# Patient Record
Sex: Male | Born: 1960 | Race: White | Hispanic: No | Smoking: Heavy tobacco smoker
Health system: Southern US, Community
[De-identification: ages and names within clinical notes are randomized; demographics above are authoritative.]

## PROBLEM LIST (undated history)

## (undated) DIAGNOSIS — N189 Chronic kidney disease, unspecified: Secondary | ICD-10-CM

## (undated) DIAGNOSIS — E785 Hyperlipidemia, unspecified: Secondary | ICD-10-CM

## (undated) DIAGNOSIS — M1990 Unspecified osteoarthritis, unspecified site: Secondary | ICD-10-CM

## (undated) DIAGNOSIS — I10 Essential (primary) hypertension: Secondary | ICD-10-CM

## (undated) DIAGNOSIS — R0602 Shortness of breath: Secondary | ICD-10-CM

## (undated) DIAGNOSIS — K219 Gastro-esophageal reflux disease without esophagitis: Secondary | ICD-10-CM

## (undated) DIAGNOSIS — F419 Anxiety disorder, unspecified: Secondary | ICD-10-CM

## (undated) DIAGNOSIS — J449 Chronic obstructive pulmonary disease, unspecified: Secondary | ICD-10-CM

## (undated) HISTORY — PX: ANAL FISTULOTOMY: SHX6423

## (undated) HISTORY — PX: CYSTOSCOPY: SUR368

## (undated) HISTORY — PX: OTHER SURGICAL HISTORY: SHX169

---

## 2004-06-19 ENCOUNTER — Ambulatory Visit (HOSPITAL_COMMUNITY): Admission: RE | Admit: 2004-06-19 | Discharge: 2004-06-19 | Payer: Self-pay | Admitting: *Deleted

## 2004-12-02 ENCOUNTER — Ambulatory Visit (HOSPITAL_COMMUNITY): Admission: RE | Admit: 2004-12-02 | Discharge: 2004-12-02 | Payer: Self-pay | Admitting: Family Medicine

## 2006-04-04 ENCOUNTER — Ambulatory Visit (HOSPITAL_COMMUNITY): Admission: RE | Admit: 2006-04-04 | Discharge: 2006-04-04 | Payer: Self-pay | Admitting: Family Medicine

## 2007-01-04 ENCOUNTER — Ambulatory Visit (HOSPITAL_COMMUNITY): Admission: RE | Admit: 2007-01-04 | Discharge: 2007-01-04 | Payer: Self-pay | Admitting: General Surgery

## 2008-06-24 ENCOUNTER — Ambulatory Visit (HOSPITAL_COMMUNITY): Admission: RE | Admit: 2008-06-24 | Discharge: 2008-06-24 | Payer: Self-pay | Admitting: Family Medicine

## 2008-07-24 ENCOUNTER — Ambulatory Visit (HOSPITAL_COMMUNITY): Admission: RE | Admit: 2008-07-24 | Discharge: 2008-07-24 | Payer: Self-pay | Admitting: Family Medicine

## 2008-07-28 ENCOUNTER — Ambulatory Visit (HOSPITAL_COMMUNITY): Admission: RE | Admit: 2008-07-28 | Discharge: 2008-07-28 | Payer: Self-pay | Admitting: Family Medicine

## 2008-08-27 ENCOUNTER — Encounter: Admission: RE | Admit: 2008-08-27 | Discharge: 2008-08-27 | Payer: Self-pay | Admitting: Neurosurgery

## 2008-10-20 ENCOUNTER — Encounter: Admission: RE | Admit: 2008-10-20 | Discharge: 2008-10-20 | Payer: Self-pay | Admitting: Neurosurgery

## 2009-03-30 ENCOUNTER — Encounter: Admission: RE | Admit: 2009-03-30 | Discharge: 2009-03-30 | Payer: Self-pay | Admitting: Neurosurgery

## 2010-05-24 ENCOUNTER — Ambulatory Visit (HOSPITAL_COMMUNITY): Admission: RE | Admit: 2010-05-24 | Discharge: 2010-05-24 | Payer: Self-pay | Admitting: Internal Medicine

## 2010-06-13 ENCOUNTER — Ambulatory Visit (HOSPITAL_BASED_OUTPATIENT_CLINIC_OR_DEPARTMENT_OTHER): Admission: RE | Admit: 2010-06-13 | Discharge: 2010-06-13 | Payer: Self-pay | Admitting: Urology

## 2010-09-22 ENCOUNTER — Encounter: Payer: Self-pay | Admitting: Neurosurgery

## 2010-12-24 ENCOUNTER — Ambulatory Visit: Payer: BC Managed Care – PPO | Attending: Family Medicine

## 2010-12-24 DIAGNOSIS — G4733 Obstructive sleep apnea (adult) (pediatric): Secondary | ICD-10-CM | POA: Insufficient documentation

## 2011-01-01 NOTE — Procedures (Signed)
NAME:  FLYNN, GWYN NO.:  [id number]  MEDICAL RECORD NO.:  [id number]          PATIENT TYPE:  OUT  LOCATION:  SLEEP LAB                     FACILITY:  [facility]  PHYSICIAN:  Edenilson Austad A. Gerilyn Pilgrim, M.D. DATE OF BIRTH:  1961/05/24  DATE OF STUDY:  12/24/2010                           NOCTURNAL POLYSOMNOGRAM  REFERRING PHYSICIAN:  INDICATION FOR STUDY:  This is a 50 year old man who presents with snoring, witnessed apnea, restless sleep, and insomnia.  This study is being done to evaluate for obstructive sleep apnea syndrome.  EPWORTH SLEEPINESS SCORE:  Epworth sleepiness scale 6.  BMI 41.  MEDICATIONS:  Celebrex, Lortab, Protonix, and Valium.  ALLERGIES: 1. ALBUTEROL. 2. SYMBICORT. 3. SIMVASTATIN. 4. VALIUM.  SLEEP ARCHITECTURE:  The total recording time is 369 minutes.  Sleep efficiency is 85%.  Sleep latency 12 minutes.  REM latency 113 minutes. Stage N1 6.5%, N2 78.4%, N3 30.8%, and REM sleep 11.3%.  RESPIRATORY DATA:  Baseline oxygen saturation is 96, lowest saturation 84 during REM sleep.  Diagnostic AHI is 8.2 and RDI 9.  OXYGEN DATA:  CARDIAC DATA:  Average heart rate is 78 with no significant dysrhythmias observed.  MOVEMENT-PARASOMNIA:  PLM index zero.  IMPRESSIONS-RECOMMENDATIONS:  Mild obstructive sleep apnea syndrome, not requiring positive pressure treatment.     Vandana Haman A. Gerilyn Pilgrim, M.D. Electronically Signed 01/02/2011 10:47:03    KAD/MEDQ  D:  01/01/2011 09:31:45  T:  01/01/2011 21:43:53  Job:  [id number]

## 2011-01-17 NOTE — H&P (Signed)
NAME:  Brandon Nelson, RAO NO.:  [id number]   MEDICAL RECORD NO.:  [id number]           PATIENT TYPE:   LOCATION:                                 FACILITY:   PHYSICIAN:  Dalia Heading, M.D.  DATE OF BIRTH:  1961/04/04   DATE OF ADMISSION:  06/18/2004  DATE OF DISCHARGE:  LH                                HISTORY & PHYSICAL   CHIEF COMPLAINT:  Hematochezia.   HISTORY OF PRESENT ILLNESS:  The patient is a 50 year old white male who was  referred for endoscopic evaluation.  He needs colonoscopy for hematochezia.  It has been present intermittently for many years.  No abdominal pain,  weight loss, nausea, vomiting, diarrhea, constipation, or melena have been  noted.  He has never had a colonoscopy.  There is no family history of colon  carcinoma.   PAST MEDICAL HISTORY:  COPD.   PAST SURGICAL HISTORY:  Knee and foot surgeries.   CURRENT MEDICATIONS:  1.  Hydrocortisone suppositories.  2.  Ibuprofen p.r.n.  3.  Albuterol inhalers as needed.   ALLERGIES:  No known drug allergies.   REVIEW OF SYSTEMS:  The patient does smoke heavily daily.  He denies any  significant alcohol use.  Denies any other cardiopulmonary difficulties or  bleeding disorders.   PHYSICAL EXAMINATION:  GENERAL:  The patient is a well-developed, well-  nourished white male in no acute distress.  VITAL SIGNS:  He is afebrile, and vital signs are stable.  LUNGS:  Clear to auscultation with equal breath sounds bilaterally.  HEART:  Regular rate and rhythm without S3, S4, or murmurs.  ABDOMEN:  Soft, nontender, nondistended.  No hepatosplenomegaly or masses  are noted,.  RECTAL:  Examination deferred to the procedure.   IMPRESSION:  Hematochezia.   PLAN:  The patient is scheduled for a colonoscopy on June 19, 2004.  The  risks and benefits of the procedure including bleeding and perforation were  fully explained to the patient who gave informed consent.     Mark   MAJ/MEDQ  D:   06/18/2004  T:  06/18/2004  Job:  [id number]   cc:   [facility]  Fax: [phone]

## 2011-01-17 NOTE — H&P (Signed)
NAME:  Brandon Nelson, Brandon Nelson                  ACCOUNT NO.:  O   MEDICAL RECORD NO.:  [id number]          PATIENT TYPE:  AMB   LOCATION:  DAY                           FACILITY:  [facility]   PHYSICIAN:  Dalia Heading, M.D.  DATE OF BIRTH:  1961/01/05   DATE OF ADMISSION:  DATE OF DISCHARGE:  LH                              HISTORY & PHYSICAL   CHIEF COMPLAINT:  Recurrent perirectal abscess.   HISTORY OF PRESENT ILLNESS:  The patient is a 50 year old white male who  was is referred for evaluation and treatment of a recurrent perirectal  abscess.  This was  treated earlier this year with antibiotics.  It has  subsequently returned.  He now presents for surgery.   PAST MEDICAL HISTORY:  Past medical history includes COPD.   PAST SURGICAL HISTORY:  Knee surgeries.   CURRENT MEDICATIONS:  Proventil and nebulizers.   ALLERGIES:  No known drug allergies.   REVIEW OF SYSTEMS:  The patient smokes two pack cigarettes a day.  Denies any alcohol use.   PHYSICAL EXAMINATION:  GENERAL:  the patient is a well-developed, well-  nourished white male in no acute distress.  LUNGS:  Clear to auscultation with equal breath sounds bilaterally.  HEART:  Regular rate and rhythm without S3, S4, or murmurs.  RECTAL:  A draining abscess along the left side of the anus.   IMPRESSION:  Recurrent perirectal abscess, question anal fistula.   PLAN:  The patient is scheduled for an anal fistulotomy on Jan 04, 2007.  Risks and benefits of the procedure including bleeding, infection, and  recurrence of the abscess were fully explained to the patient who, gave  informed consent.      Dalia Heading, M.D.  Electronically Signed     MAJ/MEDQ  D:  12/31/2006  T:  12/31/2006  Job:  [id number]   cc:   [facility]  Fax: [phone]   Patrica Duel, M.D.  Fax: [phone]

## 2011-01-17 NOTE — Op Note (Signed)
NAME:  Brandon Nelson, Brandon Nelson NO.:  [id number]   MEDICAL RECORD NO.:  [id number]          PATIENT TYPE:  AMB   LOCATION:  DAY                           FACILITY:  [facility]   PHYSICIAN:  Dalia Heading, M.D.  DATE OF BIRTH:  02-15-1961   DATE OF PROCEDURE:  01/04/2007  DATE OF DISCHARGE:                               OPERATIVE REPORT   PREOPERATIVE DIAGNOSIS:  Anal fistula.   POSTOPERATIVE DIAGNOSIS:  Anal fistula.   PROCEDURE:  Anal fistulotomy.   SURGEON:  Dr. Franky Macho.   ANESTHESIA:  General.   INDICATIONS:  The patient is a 50 year old white male with recurrent  perirectal abscess along the left side of the anus.  The patient now  comes to the operating for a fistulotomy.  Risks and benefits of  procedure, the procedure including bleeding, infection, and recurrence  of the fistula were fully explained to the patient, who gave informed  consent.   PROCEDURE NOTE:  The patient was placed in the lithotomy position after  general anesthesia was administered.  The perineum was prepped and  draped in the usual sterile technique with Betadine.  Surgical site  confirmation was performed.   The patient was noted to have an indurated, closed area at the 2 o'clock  position at the anal verge.  Incision was made and the patient was noted  to have a significant amount of granulation and necrotic subcutaneous  tissue.  This was debrided without difficulty.  Care was taken to avoid  the external sphincter muscle.  A probe was then used to unroof the  tract.  It was noted to go submucosally, though no direct entrance into  the rectum at the dentate line was noted.  The track was extended  slightly, though the whole tunnel was not unroofed as there was no  entrance point.  0.5 cm Sensorcaine was instilled in the surrounding  wound.  Any bleeding was controlled using Bovie electrocautery.  Viscous  Xylocaine was then placed around the anus.   All tape and counts correct end  the procedure.  The patient was awakened  and transferred to PACU in stable condition.   COMPLICATIONS:  None.   SPECIMEN:  None.   BLOOD LOSS:  Minimal.      Dalia Heading, M.D.  Electronically Signed     MAJ/MEDQ  D:  01/04/2007  T:  01/04/2007  Job:  [id number]   cc:   Patrica Duel, M.D.  Fax: [phone]

## 2012-02-23 ENCOUNTER — Other Ambulatory Visit: Payer: Self-pay | Admitting: Orthopedic Surgery

## 2012-02-23 DIAGNOSIS — M25511 Pain in right shoulder: Secondary | ICD-10-CM

## 2012-03-06 ENCOUNTER — Ambulatory Visit
Admission: RE | Admit: 2012-03-06 | Discharge: 2012-03-06 | Disposition: A | Discharge status: Home / Self Care | Payer: BC Managed Care – PPO | Source: Ambulatory Visit | Attending: Orthopedic Surgery | Admitting: Orthopedic Surgery

## 2012-03-06 DIAGNOSIS — M25511 Pain in right shoulder: Secondary | ICD-10-CM

## 2013-07-05 ENCOUNTER — Encounter (HOSPITAL_COMMUNITY): Payer: Self-pay | Admitting: Pharmacy Technician

## 2013-07-05 ENCOUNTER — Encounter (HOSPITAL_COMMUNITY): Payer: Self-pay

## 2013-07-05 ENCOUNTER — Other Ambulatory Visit: Payer: Self-pay

## 2013-07-05 ENCOUNTER — Encounter (HOSPITAL_COMMUNITY)
Admission: RE | Admit: 2013-07-05 | Discharge: 2013-07-05 | Disposition: A | Discharge status: Home / Self Care | Payer: BC Managed Care – PPO | Source: Ambulatory Visit | Attending: Urology | Admitting: Urology

## 2013-07-05 HISTORY — DX: Chronic kidney disease, unspecified: N18.9

## 2013-07-05 HISTORY — DX: Chronic obstructive pulmonary disease, unspecified: J44.9

## 2013-07-05 HISTORY — DX: Gastro-esophageal reflux disease without esophagitis: K21.9

## 2013-07-05 HISTORY — DX: Shortness of breath: R06.02

## 2013-07-05 HISTORY — DX: Unspecified osteoarthritis, unspecified site: M19.90

## 2013-07-05 HISTORY — DX: Hyperlipidemia, unspecified: E78.5

## 2013-07-05 HISTORY — DX: Anxiety disorder, unspecified: F41.9

## 2013-07-05 LAB — BASIC METABOLIC PANEL
BUN: 17 mg/dL (ref 6–23)
CO2: 28 mEq/L (ref 19–32)
Calcium: 9.4 mg/dL (ref 8.4–10.5)
Creatinine, Ser: 0.74 mg/dL (ref 0.50–1.35)
GFR calc Af Amer: >90 mL/min (ref >90)
GFR calc non Af Amer: >90 mL/min (ref >90)
Glucose, Bld: 145 mg/dL — ABNORMAL HIGH (ref 70–99)
Potassium: 3.9 mEq/L (ref 3.5–5.1)

## 2013-07-05 LAB — CBC
Platelets: 283 10*3/uL (ref 150–400)
RDW: 13.3 % (ref 11.5–15.5)

## 2013-07-05 NOTE — Patient Instructions (Addendum)
Brandon Nelson  07/05/2013   Your procedure is scheduled on:  07/06/13  Report to [facility] at 1030 AM.  Call this number if you have problems the morning of surgery: [phone]   Remember:   Do not eat food or drink liquids after midnight.   Take these medicines the morning of surgery with A SIP OF WATER: zofran, protonix, valium & pain pill.  Use symbicort inhaler before you come.   Do not wear jewelry, make-up or nail polish.  Do not wear lotions, powders, or perfumes. You may wear deodorant.  Do not shave 48 hours prior to surgery. Men may shave face and neck.  Do not bring valuables to the hospital.  [facility] is not responsible                  for any belongings or valuables.               Contacts, dentures or bridgework may not be worn into surgery.  Leave suitcase in the car. After surgery it may be brought to your room.  For patients admitted to the hospital, discharge time is determined by your                treatment team.               Patients discharged the day of surgery will not be allowed to drive  home.  Name and phone number of your driver: family  Special Instructions: Shower using CHG 2 nights before surgery and the night before surgery.  If you shower the day of surgery use CHG.  Use special wash - you have one bottle of CHG for all showers.  You should use approximately 1/3 of the bottle for each shower.   Please read over the following fact sheets that you were given: Pain Booklet, Surgical Site Infection Prevention, Anesthesia Post-op Instructions and Care and Recovery After Surgery   PATIENT INSTRUCTIONS POST-ANESTHESIA  IMMEDIATELY FOLLOWING SURGERY:  Do not drive or operate machinery for the first twenty four hours after surgery.  Do not make any important decisions for twenty four hours after surgery or while taking narcotic pain medications or sedatives.  If you develop intractable nausea and vomiting or a severe headache please notify your doctor  immediately.  FOLLOW-UP:  Please make an appointment with your surgeon as instructed. You do not need to follow up with anesthesia unless specifically instructed to do so.  WOUND CARE INSTRUCTIONS (if applicable):  Keep a dry clean dressing on the anesthesia/puncture wound site if there is drainage.  Once the wound has quit draining you may leave it open to air.  Generally you should leave the bandage intact for twenty four hours unless there is drainage.  If the epidural site drains for more than 36-48 hours please call the anesthesia department.  QUESTIONS?:  Please feel free to call your physician or the hospital operator if you have any questions, and they will be happy to assist you.      Cystoscopy Cystoscopy is a procedure that is used to help your caregiver diagnose and sometimes treat conditions that affect your lower urinary tract. Your lower urinary tract includes your bladder and the tube through which urine passes from your bladder out of your body (urethra). Cystoscopy is performed with a thin, tube-shaped instrument (cystoscope). The cystoscope has lenses and a light at the end so that your caregiver can see inside your bladder. The cystoscope is inserted  at the entrance of your urethra. Your caregiver guides it through your urethra and into your bladder. There are two main types of cystoscopy:  Flexible cystoscopy (with a flexible cystoscope).  Rigid cystoscopy (with a rigid cystoscope). Cystoscopy may be recommended for many conditions, including:  Urinary tract infections.  Blood in your urine (hematuria).  Loss of bladder control (urinary incontinence) or overactive bladder.  Unusual cells found in a urine sample.  Urinary blockage.  Painful urination. Cystoscopy may also be done to remove a sample of your tissue to be checked under a microscope (biopsy). It may also be done to remove or destroy bladder stones. LET YOUR CAREGIVER KNOW ABOUT:  Allergies to food or  medicine.  Medicines taken, including vitamins, herbs, eyedrops, over-the-counter medicines, and creams.  Use of steroids (by mouth or creams).  Previous problems with anesthetics or numbing medicines.  History of bleeding problems or blood clots.  Previous surgery.  Other health problems, including diabetes and kidney problems.  Possibility of pregnancy, if this applies. PROCEDURE The area around the opening to your urethra will be cleaned. A medicine to numb your urethra (local anesthetic) is used. If a tissue sample or stone is removed during the procedure, you may be given a medicine to make you sleep (general anesthetic). Your caregiver will gently insert the tip of the cystoscope into your urethra. The cystoscope will be slowly glided through your urethra and into your bladder. Sterile fluid will flow through the cystoscope and into your bladder. The fluid will expand and stretch your bladder. This gives your caregiver a better view of your bladder walls. The procedure lasts about 15 20 minutes. AFTER THE PROCEDURE If a local anesthetic is used, you will be allowed to go home as soon as you are ready. If a general anesthetic is used, you will be taken to a recovery area until you are stable. You may have temporary bleeding and burning on urination. Document Released: 08/15/2000 Document Revised: 05/12/2012 Document Reviewed: 02/09/2012 [facility] Patient Information 2014 [place], [place].

## 2013-07-05 NOTE — Progress Notes (Signed)
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 Per hospital policy blood was drawn for exposure panel. 

## 2013-07-06 ENCOUNTER — Encounter (HOSPITAL_COMMUNITY): Payer: Self-pay | Admitting: *Deleted

## 2013-07-06 ENCOUNTER — Ambulatory Visit (HOSPITAL_COMMUNITY): Payer: BC Managed Care – PPO | Admitting: Anesthesiology

## 2013-07-06 ENCOUNTER — Ambulatory Visit (HOSPITAL_COMMUNITY)
Admission: RE | Admit: 2013-07-06 | Discharge: 2013-07-06 | Disposition: A | Discharge status: Home / Self Care | Payer: BC Managed Care – PPO | Source: Ambulatory Visit | Attending: Urology | Admitting: Urology

## 2013-07-06 ENCOUNTER — Encounter (HOSPITAL_COMMUNITY): Payer: BC Managed Care – PPO | Admitting: Anesthesiology

## 2013-07-06 ENCOUNTER — Encounter (HOSPITAL_COMMUNITY)
Admission: RE | Disposition: A | Discharge status: Home / Self Care | Payer: Self-pay | Source: Ambulatory Visit | Attending: Urology

## 2013-07-06 ENCOUNTER — Ambulatory Visit (HOSPITAL_COMMUNITY): Payer: BC Managed Care – PPO

## 2013-07-06 DIAGNOSIS — J449 Chronic obstructive pulmonary disease, unspecified: Secondary | ICD-10-CM | POA: Insufficient documentation

## 2013-07-06 DIAGNOSIS — J4489 Other specified chronic obstructive pulmonary disease: Secondary | ICD-10-CM | POA: Insufficient documentation

## 2013-07-06 DIAGNOSIS — N201 Calculus of ureter: Secondary | ICD-10-CM | POA: Insufficient documentation

## 2013-07-06 HISTORY — PX: URETEROSCOPY: SHX842

## 2013-07-06 HISTORY — PX: CYSTOSCOPY W/ RETROGRADES: SHX1426

## 2013-07-06 HISTORY — PX: STONE EXTRACTION WITH BASKET: SHX5318

## 2013-07-06 SURGERY — ERCP, WITH LITHROTRIPSY OR REMOVAL OF COMMON BILE DUCT CALCULUS USING BASKET
Anesthesia: General | Site: Bladder | Laterality: Right | Wound class: Clean Contaminated

## 2013-07-06 MED ORDER — IOHEXOL 350 MG/ML SOLN
INTRAVENOUS | Status: DC | PRN
  Administered 2013-07-06: 50 mL via INTRAVENOUS

## 2013-07-06 MED ORDER — FENTANYL CITRATE 0.05 MG/ML IJ SOLN
INTRAMUSCULAR | Status: AC
  Filled 2013-07-06: qty 2

## 2013-07-06 MED ORDER — ROCURONIUM BROMIDE 100 MG/10ML IV SOLN
INTRAVENOUS | Status: DC | PRN
  Administered 2013-07-06: 20 mg via INTRAVENOUS

## 2013-07-06 MED ORDER — NEOSTIGMINE METHYLSULFATE 1 MG/ML IJ SOLN
INTRAMUSCULAR | Status: DC | PRN
  Administered 2013-07-06: 4 mg via INTRAVENOUS

## 2013-07-06 MED ORDER — FENTANYL CITRATE 0.05 MG/ML IJ SOLN
25.0000 ug | INTRAMUSCULAR | Status: AC
  Administered 2013-07-06: 25 ug via INTRAVENOUS
  Filled 2013-07-06: qty 2

## 2013-07-06 MED ORDER — LIDOCAINE HCL (CARDIAC) 20 MG/ML IV SOLN
INTRAVENOUS | Status: DC | PRN
  Administered 2013-07-06: 50 mg via INTRAVENOUS

## 2013-07-06 MED ORDER — ONDANSETRON HCL 4 MG/2ML IJ SOLN: 4.0000 mg | Freq: Once | INTRAMUSCULAR | Status: DC | PRN

## 2013-07-06 MED ORDER — GLYCOPYRROLATE 0.2 MG/ML IJ SOLN
0.2000 mg | Freq: Once | INTRAMUSCULAR | Status: AC
  Administered 2013-07-06: 0.2 mg via INTRAVENOUS
  Filled 2013-07-06: qty 1

## 2013-07-06 MED ORDER — ARTIFICIAL TEARS OP OINT
TOPICAL_OINTMENT | OPHTHALMIC | Status: AC
  Filled 2013-07-06: qty 3.5

## 2013-07-06 MED ORDER — ONDANSETRON HCL 4 MG/2ML IJ SOLN
4.0000 mg | Freq: Once | INTRAMUSCULAR | Status: AC
  Administered 2013-07-06: 4 mg via INTRAVENOUS
  Filled 2013-07-06: qty 2

## 2013-07-06 MED ORDER — MIDAZOLAM HCL 2 MG/2ML IJ SOLN
1.0000 mg | INTRAMUSCULAR | Status: DC | PRN
  Administered 2013-07-06: 2 mg via INTRAVENOUS
  Filled 2013-07-06: qty 2

## 2013-07-06 MED ORDER — SUCCINYLCHOLINE CHLORIDE 20 MG/ML IJ SOLN
INTRAMUSCULAR | Status: DC | PRN
  Administered 2013-07-06: 140 mg via INTRAVENOUS

## 2013-07-06 MED ORDER — LACTATED RINGERS IV SOLN
INTRAVENOUS | Status: DC | PRN
  Administered 2013-07-06: 11:00:00 via INTRAVENOUS

## 2013-07-06 MED ORDER — GLYCOPYRROLATE 0.2 MG/ML IJ SOLN
INTRAMUSCULAR | Status: DC | PRN
  Administered 2013-07-06: 0.6 mg via INTRAVENOUS

## 2013-07-06 MED ORDER — FENTANYL CITRATE 0.05 MG/ML IJ SOLN: 25.0000 ug | INTRAMUSCULAR | Status: DC | PRN

## 2013-07-06 MED ORDER — FENTANYL CITRATE 0.05 MG/ML IJ SOLN
INTRAMUSCULAR | Status: DC | PRN
  Administered 2013-07-06 (×3): 100 ug via INTRAVENOUS

## 2013-07-06 MED ORDER — STERILE WATER FOR IRRIGATION IR SOLN
Status: DC | PRN
  Administered 2013-07-06: 500 mL

## 2013-07-06 MED ORDER — GLYCOPYRROLATE 0.2 MG/ML IJ SOLN
INTRAMUSCULAR | Status: AC
  Filled 2013-07-06: qty 3

## 2013-07-06 MED ORDER — PROPOFOL 10 MG/ML IV BOLUS
INTRAVENOUS | Status: DC | PRN
  Administered 2013-07-06: 50 mg via INTRAVENOUS
  Administered 2013-07-06: 200 mg via INTRAVENOUS

## 2013-07-06 MED ORDER — LACTATED RINGERS IV SOLN
INTRAVENOUS | Status: DC
  Administered 2013-07-06: 1000 mL via INTRAVENOUS

## 2013-07-06 MED ORDER — PROPOFOL 10 MG/ML IV BOLUS
INTRAVENOUS | Status: AC
  Filled 2013-07-06: qty 20

## 2013-07-06 SURGICAL SUPPLY — 22 items
BAG DRAIN URO TABLE W/ADPT NS (DRAPE) ×3 IMPLANT
BAG DRN 8 ADPR NS SKTRN CSTL (DRAPE) ×2
CATH 5 FR WEDGE TIP (UROLOGICAL SUPPLIES) ×3 IMPLANT
CATH OPEN TIP 5FR (CATHETERS) ×3 IMPLANT
CLOTH BEACON ORANGE TIMEOUT ST (SAFETY) ×3 IMPLANT
DILATOR UROMAX ULTRA (MISCELLANEOUS) IMPLANT
GLOVE BIO SURGEON STRL SZ7 (GLOVE) ×3 IMPLANT
GOWN STRL REIN XL XLG (GOWN DISPOSABLE) ×3 IMPLANT
GUIDEWIRE STR BENTSON 035X150 (WIRE) ×2 IMPLANT
IV NS IRRIG 3000ML ARTHROMATIC (IV SOLUTION) ×6 IMPLANT
KIT ROOM TURNOVER AP CYSTO (KITS) ×3 IMPLANT
LASER FIBER DISP (UROLOGICAL SUPPLIES) IMPLANT
LASER FIBER DISP 1000U (UROLOGICAL SUPPLIES) IMPLANT
MANIFOLD NEPTUNE II (INSTRUMENTS) ×3 IMPLANT
PACK CYSTO (CUSTOM PROCEDURE TRAY) ×3 IMPLANT
PAD ARMBOARD 7.5X6 YLW CONV (MISCELLANEOUS) ×3 IMPLANT
SET IRRIGATING DISP (SET/KITS/TRAYS/PACK) ×3 IMPLANT
STENT PERCUFLEX 4.8FRX24 (STENTS) IMPLANT
STONE RETRIEVAL GEMINI 2.4 FR (MISCELLANEOUS) ×2 IMPLANT
SYR 5ML LL (SYRINGE) ×2 IMPLANT
TOWEL OR 17X26 4PK STRL BLUE (TOWEL DISPOSABLE) ×3 IMPLANT
WIRE GUIDE BENTSON .035 15CM (WIRE) ×3 IMPLANT

## 2013-07-06 NOTE — Preoperative (Signed)
Beta Blockers   Reason not to administer Beta Blockers:Not Applicable 

## 2013-07-06 NOTE — Progress Notes (Signed)
No change in H&P on reexamination. 

## 2013-07-06 NOTE — Anesthesia Postprocedure Evaluation (Signed)
  Anesthesia Post-op Note  Patient: Brandon Nelson  Procedure(s) Performed: Procedure(s): STONE EXTRACTION WITH BASKET (Right) URETEROSCOPY (Right) CYSTOSCOPY WITH RETROGRADE PYELOGRAM (Right)  Patient Location: PACU  Anesthesia Type:General  Level of Consciousness: awake, alert , oriented and patient cooperative  Airway and Oxygen Therapy: Patient Spontanous Breathing and Patient connected to face mask oxygen  Post-op Pain: none  Post-op Assessment: Post-op Vital signs reviewed, Patient's Cardiovascular Status Stable, Respiratory Function Stable, Patent Airway, No signs of Nausea or vomiting and Pain level controlled  Post-op Vital Signs: Reviewed and stable  Complications: No apparent anesthesia complications

## 2013-07-06 NOTE — Anesthesia Procedure Notes (Signed)
Procedure Name: Intubation Date/Time: 07/06/2013 12:58 PM Performed by: Carolyne Littles, Emaya Preston L Pre-anesthesia Checklist: Patient identified, Timeout performed, Emergency Drugs available, Suction available and Patient being monitored Patient Re-evaluated:Patient Re-evaluated prior to inductionOxygen Delivery Method: Circle system utilized Preoxygenation: Pre-oxygenation with 100% oxygen Intubation Type: IV induction, Rapid sequence and Cricoid Pressure applied Tube type: Oral Number of attempts: 1 Airway Equipment and Method: Video-laryngoscopy Placement Confirmation: ETT inserted through vocal cords under direct vision,  positive ETCO2 and breath sounds checked- equal and bilateral Secured at: 22 cm Tube secured with: Tape Dental Injury: Teeth and Oropharynx as per pre-operative assessment

## 2013-07-06 NOTE — Transfer of Care (Signed)
Immediate Anesthesia Transfer of Care Note  Patient: Brandon Nelson  Procedure(s) Performed: Procedure(s): STONE EXTRACTION WITH BASKET (Right) URETEROSCOPY (Right) CYSTOSCOPY WITH RETROGRADE PYELOGRAM (Right)  Patient Location: PACU  Anesthesia Type:General  Level of Consciousness: awake, alert , oriented and patient cooperative  Airway & Oxygen Therapy: Patient Spontanous Breathing and Patient connected to face mask oxygen  Post-op Assessment: Report given to PACU RN and Post -op Vital signs reviewed and stable  Post vital signs: Reviewed and stable  Complications: No apparent anesthesia complications

## 2013-07-06 NOTE — Op Note (Signed)
Op note [phone]

## 2013-07-06 NOTE — Anesthesia Preprocedure Evaluation (Signed)
Anesthesia Evaluation  Patient identified by MRN, date of birth, ID band Patient awake    Reviewed: Allergy & Precautions, H&P , NPO status , Patient's Chart, lab work & pertinent test results  Airway Mallampati: II TM Distance: >3 FB Neck ROM: Full    Dental  (+) Teeth Intact and Poor Dentition   Pulmonary shortness of breath and with exertion, COPDCurrent Smoker,  + rhonchi         Cardiovascular Rhythm:Regular Rate:Normal     Neuro/Psych PSYCHIATRIC DISORDERS Anxiety    GI/Hepatic GERD-  Medicated and Controlled,  Endo/Other    Renal/GU Renal disease     Musculoskeletal   Abdominal   Peds  Hematology   Anesthesia Other Findings   Reproductive/Obstetrics                           Anesthesia Physical Anesthesia Plan  ASA: III  Anesthesia Plan: General   Post-op Pain Management:    Induction: Intravenous, Rapid sequence and Cricoid pressure planned  Airway Management Planned: Oral ETT  Additional Equipment:   Intra-op Plan:   Post-operative Plan: Extubation in OR  Informed Consent: I have reviewed the patients History and Physical, chart, labs and discussed the procedure including the risks, benefits and alternatives for the proposed anesthesia with the patient or authorized representative who has indicated his/her understanding and acceptance.     Plan Discussed with:   Anesthesia Plan Comments:         Anesthesia Quick Evaluation

## 2013-07-07 NOTE — H&P (Signed)
NAME:  BRANCH, PACITTI NO.:  [id number]  MEDICAL RECORD NO.:  [id number]  LOCATION:                            FACILITY:  [place]  PHYSICIAN:  Ky Barban, M.D.DATE OF BIRTH:  02-Aug-1961  DATE OF ADMISSION:  07/06/2013 DATE OF DISCHARGE:  LH                             HISTORY & PHYSICAL   CHIEF COMPLAINT:  Recurrent right flank pain.  HISTORY:  A 52 year old gentleman who was seen by me yesterday again after having a CT scan.  He has 3 mm stone in the right ureterovesical junction.  He is having pain since Sunday.  He has gone to the emergency room in [facility] where he was seen on this past Sunday with right flank pain.  CT scan showed there is a distal right ureteral calculus, 2 mm in size, causing partial obstruction.  He continued to have pain.  He want me to do something about it.  He wants to go back to work and I have advised him that we can go ahead and do stone basket procedure.  Risks and complications were discussed in detail, and I had to do another CT scan which was done yesterday, shows stone of 3 mm located in the right ureterovesical junction causing partial obstruction.  He has no other stones.  He told me has several stone baskets done in the past because every time he has a stone.  They have to go and do something to get it out, but this stone is not very big.  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 He wants to do something about it.  So, I have told him the stone basket's complication especially ureteral perforation leading to open surgery although it is not common but has a good possibility, sometime it can happen and also there is a possibility of stone migration.  I wont to be able to get a stone out, but then I can leave a double-J stent, and he can go back to work with the stent.  No use of  double-J stent was also discussed.  No fever chills or any voiding difficulty.  PAST MEDICAL HISTORY:  No hypertension or diabetes.  Has COPD and takes medicines for that.  PAST SURGICAL HISTORY:  Arthroscopic surgery on both knees 3 years ago.  FAMILY HISTORY:  No history of prostate cancer.  REVIEW OF SYSTEMS:  Unremarkable.  PHYSICAL EXAMINATION:  VITAL SIGNS:  His blood pressure is 141/72, temperature 98.6. CENTRAL NERVOUS SYSTEM:  No gross neurological deficit. HEAD, NECK, EYES, ENT:  Negative. CHEST:  Symmetrical.  Normal breath sounds. HEART:  Regular sinus rhythm. ABDOMEN:  Soft, flat.  Liver, spleen, kidneys not palpable.  A 1+ right CVA tenderness. EXTERNAL GENITALIA:  Uncircumcised.  Meatus is adequate.  Testicles are normal. RECTAL:  Prostate is 0.5+.  Smooth and firm.  No rectal mass.  PLAN:  Cystoscopy, right retrograde pyelogram, ureteroscopic stone basket extraction, use of double-J stent under anesthesia as outpatient. Procedure and its limitations and complications are discussed.     [employer]  Sydnee Cabal, M.D.     MIJ/MEDQ  D:  07/06/2013  T:  07/06/2013  Job:  [id number]

## 2013-07-08 ENCOUNTER — Encounter (HOSPITAL_COMMUNITY): Payer: Self-pay | Admitting: Urology

## 2013-07-08 NOTE — Op Note (Signed)
NAME:  Brandon Nelson, Brandon Nelson NO.:  [id number]  MEDICAL RECORD NO.:  [id number]  LOCATION:                                 FACILITY:  PHYSICIAN:  Ky Barban, M.D.DATE OF BIRTH:  09/22/1960  DATE OF PROCEDURE: DATE OF DISCHARGE:                              OPERATIVE REPORT   PREOPERATIVE DIAGNOSIS:  Right ureteral calculus.  POSTOPERATIVE DIAGNOSIS:  Right ureteral calculus.  PROCEDURE:  Cystoscopy, ureteroscopic stone basket extraction.  ANESTHESIA:  General.  DESCRIPTION OF PROCEDURE:  The patient under general endotracheal anesthesia in lithotomy position, after usual prep and drape, #25 cystoscope introduced into the bladder.  I can see the stone through the ureteral orifice, so I did not do a retrograde pyelogram to start with, but I had to use ureteroscope to get the basket into the ureter because it will not go through the cystoscope.  Once the ureteroscope was introduced, I could see the orifice and easily advanced the basket through it and engaged the stone which was removed without any difficulty, then it fell into the bladder.  I have to put cystoscope to get the stone out with the help of a biopsy forceps.  Once the stone was out, I put wedge catheter into the cystoscope, and right retrograde pyelogram was done, it looks like there is air bubble and but there is no stone, the ureter drained fine.  I decided not to leave any stent in. All the instruments were removed.  The patient left the operating room in satisfactory condition.     Ky Barban, M.D.     MIJ/MEDQ  D:  07/06/2013  T:  07/07/2013  Job:  [id number]

## 2013-10-03 DIAGNOSIS — M79606 Pain in leg, unspecified: Secondary | ICD-10-CM | POA: Insufficient documentation

## 2014-10-25 ENCOUNTER — Encounter (HOSPITAL_COMMUNITY): Payer: Self-pay

## 2014-10-25 ENCOUNTER — Ambulatory Visit (HOSPITAL_COMMUNITY)
Admission: RE | Admit: 2014-10-25 | Discharge: 2014-10-25 | Disposition: A | Discharge status: Home / Self Care | Payer: BLUE CROSS/BLUE SHIELD | Source: Ambulatory Visit | Attending: Family Medicine | Admitting: Family Medicine

## 2014-10-25 ENCOUNTER — Other Ambulatory Visit (HOSPITAL_COMMUNITY): Payer: Self-pay | Admitting: Family Medicine

## 2014-10-25 DIAGNOSIS — N200 Calculus of kidney: Secondary | ICD-10-CM | POA: Diagnosis not present

## 2014-10-25 DIAGNOSIS — K760 Fatty (change of) liver, not elsewhere classified: Secondary | ICD-10-CM | POA: Insufficient documentation

## 2014-10-25 DIAGNOSIS — R52 Pain, unspecified: Secondary | ICD-10-CM

## 2014-10-25 DIAGNOSIS — K5730 Diverticulosis of large intestine without perforation or abscess without bleeding: Secondary | ICD-10-CM | POA: Insufficient documentation

## 2014-10-25 DIAGNOSIS — R1012 Left upper quadrant pain: Secondary | ICD-10-CM | POA: Diagnosis not present

## 2014-10-25 LAB — POCT I-STAT CREATININE: CREATININE: 0.8 mg/dL (ref 0.50–1.35)

## 2014-10-25 MED ORDER — IOHEXOL 300 MG/ML  SOLN
120.0000 mL | Freq: Once | INTRAMUSCULAR | Status: AC | PRN
  Administered 2014-10-25: 120 mL via INTRAVENOUS

## 2014-11-27 ENCOUNTER — Other Ambulatory Visit: Payer: Self-pay | Admitting: Orthopedic Surgery

## 2014-11-27 DIAGNOSIS — M5412 Radiculopathy, cervical region: Secondary | ICD-10-CM

## 2014-12-23 ENCOUNTER — Ambulatory Visit
Admission: RE | Admit: 2014-12-23 | Discharge: 2014-12-23 | Disposition: A | Discharge status: Home / Self Care | Payer: BLUE CROSS/BLUE SHIELD | Source: Ambulatory Visit | Attending: Orthopedic Surgery | Admitting: Orthopedic Surgery

## 2014-12-23 DIAGNOSIS — M5412 Radiculopathy, cervical region: Secondary | ICD-10-CM

## 2014-12-26 ENCOUNTER — Other Ambulatory Visit: Payer: Self-pay | Admitting: Physical Medicine and Rehabilitation

## 2014-12-26 DIAGNOSIS — M542 Cervicalgia: Secondary | ICD-10-CM

## 2015-01-01 ENCOUNTER — Ambulatory Visit
Admission: RE | Admit: 2015-01-01 | Discharge: 2015-01-01 | Disposition: A | Discharge status: Home / Self Care | Payer: BLUE CROSS/BLUE SHIELD | Source: Ambulatory Visit | Attending: Physical Medicine and Rehabilitation | Admitting: Physical Medicine and Rehabilitation

## 2015-01-01 DIAGNOSIS — M542 Cervicalgia: Secondary | ICD-10-CM

## 2015-01-01 MED ORDER — TRIAMCINOLONE ACETONIDE 40 MG/ML IJ SUSP (RADIOLOGY)
60.0000 mg | Freq: Once | INTRAMUSCULAR | Status: AC
  Administered 2015-01-01: 60 mg via EPIDURAL

## 2015-01-01 MED ORDER — IOHEXOL 300 MG/ML  SOLN
1.0000 mL | Freq: Once | INTRAMUSCULAR | Status: AC | PRN
  Administered 2015-01-01: 1 mL via EPIDURAL

## 2015-01-01 NOTE — Discharge Instructions (Signed)

## 2015-01-24 ENCOUNTER — Other Ambulatory Visit: Payer: Self-pay | Admitting: Physical Medicine and Rehabilitation

## 2015-01-24 DIAGNOSIS — M5412 Radiculopathy, cervical region: Secondary | ICD-10-CM

## 2015-01-31 ENCOUNTER — Ambulatory Visit
Admission: RE | Admit: 2015-01-31 | Discharge: 2015-01-31 | Disposition: A | Discharge status: Home / Self Care | Payer: BLUE CROSS/BLUE SHIELD | Source: Ambulatory Visit | Attending: Physical Medicine and Rehabilitation | Admitting: Physical Medicine and Rehabilitation

## 2015-01-31 DIAGNOSIS — M5412 Radiculopathy, cervical region: Secondary | ICD-10-CM

## 2015-01-31 MED ORDER — TRIAMCINOLONE ACETONIDE 40 MG/ML IJ SUSP (RADIOLOGY)
60.0000 mg | Freq: Once | INTRAMUSCULAR | Status: AC
  Administered 2015-01-31: 60 mg via EPIDURAL

## 2015-01-31 MED ORDER — IOHEXOL 300 MG/ML  SOLN
1.0000 mL | Freq: Once | INTRAMUSCULAR | Status: AC | PRN
  Administered 2015-01-31: 1 mL via EPIDURAL

## 2015-03-29 ENCOUNTER — Encounter (INDEPENDENT_AMBULATORY_CARE_PROVIDER_SITE_OTHER): Payer: Self-pay | Admitting: *Deleted

## 2015-04-25 ENCOUNTER — Ambulatory Visit (INDEPENDENT_AMBULATORY_CARE_PROVIDER_SITE_OTHER): Payer: BLUE CROSS/BLUE SHIELD | Admitting: Internal Medicine

## 2015-05-25 ENCOUNTER — Encounter (INDEPENDENT_AMBULATORY_CARE_PROVIDER_SITE_OTHER): Payer: Self-pay | Admitting: Internal Medicine

## 2015-05-25 ENCOUNTER — Ambulatory Visit (INDEPENDENT_AMBULATORY_CARE_PROVIDER_SITE_OTHER): Payer: BLUE CROSS/BLUE SHIELD | Admitting: Internal Medicine

## 2015-05-25 ENCOUNTER — Other Ambulatory Visit (INDEPENDENT_AMBULATORY_CARE_PROVIDER_SITE_OTHER): Payer: Self-pay | Admitting: Internal Medicine

## 2015-05-25 ENCOUNTER — Encounter (INDEPENDENT_AMBULATORY_CARE_PROVIDER_SITE_OTHER): Payer: Self-pay

## 2015-05-25 ENCOUNTER — Encounter (INDEPENDENT_AMBULATORY_CARE_PROVIDER_SITE_OTHER): Payer: Self-pay | Admitting: *Deleted

## 2015-05-25 VITALS — BP 142/80 | HR 72 | Temp 97.6°F | Ht 67.0 in | Wt 270.5 lb

## 2015-05-25 DIAGNOSIS — Z8709 Personal history of other diseases of the respiratory system: Secondary | ICD-10-CM

## 2015-05-25 DIAGNOSIS — R143 Flatulence: Secondary | ICD-10-CM

## 2015-05-25 DIAGNOSIS — E7800 Pure hypercholesterolemia, unspecified: Secondary | ICD-10-CM | POA: Insufficient documentation

## 2015-05-25 DIAGNOSIS — Z1211 Encounter for screening for malignant neoplasm of colon: Secondary | ICD-10-CM

## 2015-05-25 DIAGNOSIS — I10 Essential (primary) hypertension: Secondary | ICD-10-CM | POA: Insufficient documentation

## 2015-05-25 DIAGNOSIS — J449 Chronic obstructive pulmonary disease, unspecified: Secondary | ICD-10-CM | POA: Insufficient documentation

## 2015-05-25 DIAGNOSIS — M1990 Unspecified osteoarthritis, unspecified site: Secondary | ICD-10-CM | POA: Insufficient documentation

## 2015-05-25 NOTE — Progress Notes (Signed)
Subjective:    Patient ID: Brandon Nelson, male    DOB: 12-23-1960, 54 y.o.   MRN: [id number]  HPI  Referred to our office for a colonoscopy. His last colonoscopy was in 2015  By [name] forGi bleeding and revealed colitis of the sigmoid colon, otherwise normal.  He says he did not have any polyp. He tells me he has a lot of flatus in the past 3 months.  He says he will have so much gas he will have left upper quadrant pain for several days. He usually has a BM 1-2 daily. Here lately he says he may skip a day having a BM. He occasionally has diarrhea. He says really there has been no change in his stool. He is a Administrator.  He says he stops and goes to the [place] when he has to go. His appetite is good. He says he has lost about 8 pounds for the past 2-3 months. No family hx of colon cancer. 10/27/2014 CT abdomen/pelvis with CM: left lower quadrant pain: IMPRESSION: Mild Diverticulosis. No radiographic evidence of diverticulitis or other acute findings.  810/19/2005 Colonoscopy: GI bleeding: [name]: colitis of the sigmoid colon, otherwise normal.  Review of Systems Past Medical History  Diagnosis Date  . Hyperlipidemia   . COPD (chronic obstructive pulmonary disease)   . GERD (gastroesophageal reflux disease)   . Chronic kidney disease     kidney stones  . Arthritis   . Anxiety   . Shortness of breath     exertion    Past Surgical History  Procedure Laterality Date  . Right tendon foot    . Right knee arthroscopy    . Left knee arthroscopy    . Cystoscopy    . Anal fistulotomy    . Stone extraction with basket Right 07/06/2013    Procedure: STONE EXTRACTION WITH BASKET;  Surgeon: [name], MD;  Location: [facility];  Service: Urology;  Laterality: Right;  . Ureteroscopy Right 07/06/2013    Procedure: URETEROSCOPY;  Surgeon: [name], MD;  Location: [facility];  Service: Urology;  Laterality: Right;  . Cystoscopy w/ retrogrades Right 07/06/2013   Procedure: CYSTOSCOPY WITH RETROGRADE PYELOGRAM;  Surgeon: [name], MD;  Location: [facility];  Service: Urology;  Laterality: Right;    No Known Allergies  Current Outpatient Prescriptions on File Prior to Visit  Medication Sig Dispense Refill  . albuterol (PROVENTIL HFA;VENTOLIN HFA) 108 (90 BASE) MCG/ACT inhaler Inhale 2 puffs into the lungs.    . budesonide-formoterol (SYMBICORT) 160-4.5 MCG/ACT inhaler Inhale 2 puffs into the lungs 2 (two) times daily.    . diazepam (VALIUM) 10 MG tablet Take 10 mg by mouth as needed for anxiety.    . fluticasone (FLOVENT DISKUS) 50 MCG/BLIST diskus inhaler Inhale 1 puff into the lungs.    Marland Kitchen HYDROcodone-acetaminophen (NORCO) 10-325 MG per tablet Take 1 tablet by mouth every 6 (six) hours as needed.    . meloxicam (MOBIC) 7.5 MG tablet Take 7.5 mg by mouth 2 (two) times daily.    . pantoprazole (PROTONIX) 40 MG tablet Take 40 mg by mouth daily.    . simvastatin (ZOCOR) 20 MG tablet Take 20 mg by mouth daily.    Marland Kitchen azelastine (ASTELIN) 0.1 % nasal spray Place 1 spray into the nose.     No current facility-administered medications on file prior to visit.        Objective:   Physical Exam Blood pressure 142/80, pulse  72, temperature 97.6 F (36.4 C), height 5\' 7"  (1.702 m), weight 270 lb 8 oz (122.698 kg).  Alert and oriented. Skin warm and dry. Oral mucosa is moist.   . Sclera anicteric, conjunctivae is pink. Thyroid not enlarged. No cervical lymphadenopathy. Lungs clear. Heart regular rate and rhythm.  Abdomen is soft. Bowel sounds are positive. No hepatomegaly. No abdominal masses felt. No tenderness.  No edema to lower extremities. Stool brown and guaiac negative     Hemocult  Lot [id number]. Exp. Date 07/2018      Assessment & Plan:  Flatus.  Flatus diet given to patient. Patient is due for a colonoscopy.

## 2015-05-25 NOTE — Patient Instructions (Addendum)
Colonoscopy. The risks and benefits such as perforation, bleeding, and infection were reviewed with the patient and is agreeable. Flatus diet

## 2015-07-05 ENCOUNTER — Encounter (HOSPITAL_COMMUNITY): Payer: Self-pay | Admitting: *Deleted

## 2015-07-05 ENCOUNTER — Encounter (HOSPITAL_COMMUNITY)
Admission: RE | Disposition: A | Discharge status: Home / Self Care | Payer: Self-pay | Source: Ambulatory Visit | Attending: Internal Medicine

## 2015-07-05 ENCOUNTER — Ambulatory Visit (HOSPITAL_COMMUNITY)
Admission: RE | Admit: 2015-07-05 | Discharge: 2015-07-05 | Disposition: A | Discharge status: Home / Self Care | Payer: BLUE CROSS/BLUE SHIELD | Source: Ambulatory Visit | Attending: Internal Medicine | Admitting: Internal Medicine

## 2015-07-05 DIAGNOSIS — E785 Hyperlipidemia, unspecified: Secondary | ICD-10-CM | POA: Insufficient documentation

## 2015-07-05 DIAGNOSIS — N189 Chronic kidney disease, unspecified: Secondary | ICD-10-CM | POA: Diagnosis not present

## 2015-07-05 DIAGNOSIS — R143 Flatulence: Secondary | ICD-10-CM | POA: Diagnosis not present

## 2015-07-05 DIAGNOSIS — M1990 Unspecified osteoarthritis, unspecified site: Secondary | ICD-10-CM | POA: Insufficient documentation

## 2015-07-05 DIAGNOSIS — D123 Benign neoplasm of transverse colon: Secondary | ICD-10-CM | POA: Insufficient documentation

## 2015-07-05 DIAGNOSIS — Z7951 Long term (current) use of inhaled steroids: Secondary | ICD-10-CM | POA: Insufficient documentation

## 2015-07-05 DIAGNOSIS — Z7982 Long term (current) use of aspirin: Secondary | ICD-10-CM | POA: Insufficient documentation

## 2015-07-05 DIAGNOSIS — K648 Other hemorrhoids: Secondary | ICD-10-CM

## 2015-07-05 DIAGNOSIS — K219 Gastro-esophageal reflux disease without esophagitis: Secondary | ICD-10-CM | POA: Diagnosis not present

## 2015-07-05 DIAGNOSIS — Z1211 Encounter for screening for malignant neoplasm of colon: Secondary | ICD-10-CM

## 2015-07-05 DIAGNOSIS — J449 Chronic obstructive pulmonary disease, unspecified: Secondary | ICD-10-CM | POA: Insufficient documentation

## 2015-07-05 DIAGNOSIS — Z79899 Other long term (current) drug therapy: Secondary | ICD-10-CM | POA: Diagnosis not present

## 2015-07-05 DIAGNOSIS — K644 Residual hemorrhoidal skin tags: Secondary | ICD-10-CM | POA: Diagnosis not present

## 2015-07-05 DIAGNOSIS — K6389 Other specified diseases of intestine: Secondary | ICD-10-CM | POA: Diagnosis not present

## 2015-07-05 DIAGNOSIS — I129 Hypertensive chronic kidney disease with stage 1 through stage 4 chronic kidney disease, or unspecified chronic kidney disease: Secondary | ICD-10-CM | POA: Diagnosis not present

## 2015-07-05 DIAGNOSIS — F419 Anxiety disorder, unspecified: Secondary | ICD-10-CM | POA: Diagnosis not present

## 2015-07-05 DIAGNOSIS — F17210 Nicotine dependence, cigarettes, uncomplicated: Secondary | ICD-10-CM | POA: Diagnosis not present

## 2015-07-05 HISTORY — DX: Essential (primary) hypertension: I10

## 2015-07-05 HISTORY — PX: COLONOSCOPY: SHX5424

## 2015-07-05 SURGERY — COLONOSCOPY
Anesthesia: Moderate Sedation

## 2015-07-05 MED ORDER — STERILE WATER FOR IRRIGATION IR SOLN
Status: DC | PRN
  Administered 2015-07-05: 12:00:00

## 2015-07-05 MED ORDER — MIDAZOLAM HCL 5 MG/5ML IJ SOLN
INTRAMUSCULAR | Status: DC | PRN
  Administered 2015-07-05: 1 mg via INTRAVENOUS
  Administered 2015-07-05 (×2): 2 mg via INTRAVENOUS
  Administered 2015-07-05: 3 mg via INTRAVENOUS

## 2015-07-05 MED ORDER — MEPERIDINE HCL 50 MG/ML IJ SOLN
INTRAMUSCULAR | Status: DC | PRN
Start: 2015-07-05 — End: 2015-07-05
  Administered 2015-07-05 (×2): 25 mg via INTRAVENOUS

## 2015-07-05 MED ORDER — SODIUM CHLORIDE 0.9 % IV SOLN
INTRAVENOUS | Status: DC
  Administered 2015-07-05: 11:00:00 via INTRAVENOUS

## 2015-07-05 MED ORDER — BENEFIBER DRINK MIX PO PACK: 4.0000 g | PACK | Freq: Every day | ORAL | Status: DC

## 2015-07-05 MED ORDER — MIDAZOLAM HCL 5 MG/5ML IJ SOLN
INTRAMUSCULAR | Status: AC
  Filled 2015-07-05: qty 10

## 2015-07-05 MED ORDER — DOCUSATE SODIUM 100 MG PO CAPS: 200.0000 mg | ORAL_CAPSULE | Freq: Every day | ORAL | Status: DC

## 2015-07-05 MED ORDER — MEPERIDINE HCL 50 MG/ML IJ SOLN
INTRAMUSCULAR | Status: AC
  Filled 2015-07-05: qty 1

## 2015-07-05 NOTE — H&P (Signed)
Brandon Nelson is an 54 y.o. male.   Chief Complaint: Patient's here for colonoscopy. HPI: Patient is 54 year old Caucasian male who is here for screening colonoscopy. Last exam was in 2005. He was noted to have sigmoid colitis on that exam I [name]. He has history of anal fistula and underwent fistulotomy by [name] in May 2008. He continues to have intermittent perianal swelling which ruptures and he feels better. He does give history of intermittent constipation which she reports triggers this problem. He sees negative for CRC or inflammatory bowel disease.  Past Medical History  Diagnosis Date  . Hyperlipidemia   . COPD (chronic obstructive pulmonary disease) ([place])   . GERD (gastroesophageal reflux disease)   . Chronic kidney disease     kidney stones  . Arthritis   . Anxiety   . Shortness of breath     exertion  . Hypertension     Past Surgical History  Procedure Laterality Date  . Right tendon foot    . Right knee arthroscopy    . Left knee arthroscopy    . Cystoscopy    . Anal fistulotomy    . Stone extraction with basket Right 07/06/2013    Procedure: STONE EXTRACTION WITH BASKET;  Surgeon: [name], MD;  Location: [facility];  Service: Urology;  Laterality: Right;  . Ureteroscopy Right 07/06/2013    Procedure: URETEROSCOPY;  Surgeon: [name], MD;  Location: [facility];  Service: Urology;  Laterality: Right;  . Cystoscopy w/ retrogrades Right 07/06/2013    Procedure: CYSTOSCOPY WITH RETROGRADE PYELOGRAM;  Surgeon: [name], MD;  Location: [facility];  Service: Urology;  Laterality: Right;    History reviewed. No pertinent family history. Social History:  reports that he has been smoking Cigarettes.  He has a 80 pack-year smoking history. He does not have any smokeless tobacco history on file. He reports that he drinks alcohol. He reports that he does not use illicit drugs.  Allergies: No Known Allergies  Medications Prior to Admission   Medication Sig Dispense Refill  . acetaminophen (TYLENOL) 500 MG tablet Take 500 mg by mouth every 6 (six) hours as needed for mild pain.     Marland Kitchen albuterol (PROVENTIL HFA;VENTOLIN HFA) 108 (90 BASE) MCG/ACT inhaler Inhale 2 puffs into the lungs.    Marland Kitchen amLODipine (NORVASC) 5 MG tablet Take 5 mg by mouth daily.    Marland Kitchen aspirin 81 MG tablet Take 81 mg by mouth daily.    Marland Kitchen azelastine (ASTELIN) 0.1 % nasal spray Place 1 spray into the nose.    Marland Kitchen buPROPion (WELLBUTRIN SR) 100 MG 12 hr tablet Take 100 mg by mouth 2 (two) times daily.    . diazepam (VALIUM) 10 MG tablet Take 10 mg by mouth as needed for anxiety.    . gabapentin (NEURONTIN) 300 MG capsule Take 300 mg by mouth daily.    Marland Kitchen HYDROcodone-acetaminophen (NORCO) 10-325 MG per tablet Take 1 tablet by mouth every 6 (six) hours as needed for moderate pain.     Marland Kitchen lisinopril (PRINIVIL,ZESTRIL) 20 MG tablet Take 20 mg by mouth daily.    . meloxicam (MOBIC) 7.5 MG tablet Take 15 mg by mouth at bedtime.     . pantoprazole (PROTONIX) 40 MG tablet Take 40 mg by mouth daily.    . simvastatin (ZOCOR) 20 MG tablet Take 20 mg by mouth daily.    . budesonide-formoterol (SYMBICORT) 160-4.5 MCG/ACT inhaler Inhale 2 puffs into the lungs 2 (two) times  daily.    . fluticasone (FLOVENT DISKUS) 50 MCG/BLIST diskus inhaler Inhale 1 puff into the lungs.      No results found for this or any previous visit (from the past 48 hour(s)). No results found.  ROS  Blood pressure 145/77, pulse 85, temperature 98.5 F (36.9 C), temperature source Oral, height 5\' 6"  (1.676 m), weight 272 lb (123.378 kg), SpO2 95 %. Physical Exam  Constitutional: He appears well-developed and well-nourished.  HENT:  Mouth/Throat: Oropharynx is clear and moist.  Eyes: Conjunctivae are normal. No scleral icterus.  Neck: No thyromegaly present.  Cardiovascular: Normal rate, regular rhythm and normal heart sounds.   No murmur heard. Respiratory: Effort normal.  Bilateral scattered rhonchi.   GI: Soft. He exhibits no distension and no mass. There is no tenderness.  Musculoskeletal: He exhibits no edema.  Lymphadenopathy:    He has no cervical adenopathy.  Neurological: He is alert.  Skin: Skin is warm and dry.     Assessment/Plan Patient was screening colonoscopy. History of anal fistula. Status post fistulotomy in May 2008.   Brandon,NAJEEB Nelson 07/05/2015, 11:38 AM

## 2015-07-05 NOTE — Op Note (Signed)
COLONOSCOPY PROCEDURE REPORT  PATIENT:  Brandon Nelson  MR#:  [id number] Birthdate:  [birth year], 54 y.o., male Endoscopist:  Dr. Rogene Houston, MD Referred By:  Dr. Purvis Kilts, MD Procedure Date: 07/05/2015  Procedure:   Colonoscopy  Indications:  Patient is 54 year old Caucasian male who is here for average risk screening colonoscopy. Her last exam was about 11 years ago. He has history of anal fistula for which he had surgery in 2008. He says his problem recurs every now and then when he is constipated. There no family history of inflammatory bowel disease or CRC.  Informed Consent:  The procedure and risks were reviewed with the patient and informed consent was obtained.  Medications:  Demerol 50 mg IV Versed 8 mg IV  Description of procedure:  After a digital rectal exam was performed, that colonoscope was advanced from the anus through the rectum and colon to the area of the cecum, ileocecal valve and appendiceal orifice. The cecum was deeply intubated. These structures were well-seen and photographed for the record. From the level of the cecum and ileocecal valve, the scope was slowly and cautiously withdrawn. The mucosal surfaces were carefully surveyed utilizing scope tip to flexion to facilitate fold flattening as needed. The scope was pulled down into the rectum where a thorough exam including retroflexion was performed. Terminal ileum was also examined.  Findings:   Prep satisfactory. Normal mucosa of terminal ileum. Normal mucosa of cecum, ascending colon, hepatic flexure and transverse colon. Two small polyps cold snared from splenic flexure. One polyp was lost. Focal erythema noted to mucosa at sigmoid colon but no erosions or ulcers present. Normal rectal mucosa. Hemorrhoids noted below the dentate line.    Therapeutic/Diagnostic Maneuvers Performed:  See above  Complications:  None  EBL: None  Cecal Withdrawal Time:  18  minutes  Impression:  Normal  mucosa of terminal ileum. Two small polyps were cold snared from splenic flexure. One polyp was lost. These polyps appeared to be small adenomas. Patchy erythema to mucosa of sigmoid colon without erosions or diverticulosis. These changes felt to be nonspecific most likely secondary to meloxicam. External hemorrhoids.  Recommendations:  Standard instructions given. High fiber diet. Colace 200 mg by mouth daily at bedtime. Benefiber 4 g by mouth daily at bedtime. I will contact patient with biopsy results and further recommendations.  [name],[name]  07/05/2015 12:24 PM  CC: Dr. Hilma Favors, Betsy Coder, MD & Dr. Rayne Du ref. provider found

## 2015-07-05 NOTE — Discharge Instructions (Signed)
Resume usual medications and high fiber diet. Colace 20 mg by mouth daily at bedtime. Benefiber 4 g by mouth daily at bedtime. No driving for 24 hours. Physician will call with biopsy results. Colonoscopy, Care After These instructions give you information on caring for yourself after your procedure. Your doctor may also give you more specific instructions. Call your doctor if you have any problems or questions after your procedure. HOME CARE  Do not drive for 24 hours.  Do not sign important papers or use machinery for 24 hours.  You may shower.  You may go back to your usual activities, but go slower for the first 24 hours.  Take rest breaks often during the first 24 hours.  Walk around or use warm packs on your belly (abdomen) if you have belly cramping or gas.  Drink enough fluids to keep your pee (urine) clear or pale yellow.  Resume your normal diet. Avoid heavy or fried foods.  Avoid drinking alcohol for 24 hours or as told by your doctor.  Only take medicines as told by your doctor. If a tissue sample (biopsy) was taken during the procedure:   Do not take aspirin or blood thinners for 7 days, or as told by your doctor.  Do not drink alcohol for 7 days, or as told by your doctor.  Eat soft foods for the first 24 hours. GET HELP IF: You still have a small amount of blood in your poop (stool) 2-3 days after the procedure. GET HELP RIGHT AWAY IF:  You have more than a small amount of blood in your poop.  You see clumps of tissue (blood clots) in your poop.  Your belly is puffy (swollen).  You feel sick to your stomach (nauseous) or throw up (vomit).  You have a fever.  You have belly pain that gets worse and medicine does not help. MAKE SURE YOU:  Understand these instructions.  Will watch your condition.  Will get help right away if you are not doing well or get worse.   This information is not intended to replace advice given to you by your health care  provider. Make sure you discuss any questions you have with your health care provider.   Document Released: 09/20/2010 Document Revised: 08/23/2013 Document Reviewed: 04/25/2013 Elsevier Interactive Patient Education 2016 Elsevier Inc.  High-Fiber Diet Fiber, also called dietary fiber, is a type of carbohydrate found in fruits, vegetables, whole grains, and beans. A high-fiber diet can have many health benefits. Your health care provider may recommend a high-fiber diet to help:  Prevent constipation. Fiber can make your bowel movements more regular.  Lower your cholesterol.  Relieve hemorrhoids, uncomplicated diverticulosis, or irritable bowel syndrome.  Prevent overeating as part of a weight-loss plan.  Prevent heart disease, type 2 diabetes, and certain cancers. WHAT IS MY PLAN? The recommended daily intake of fiber includes:  38 grams for men under age 78.  66 grams for men over age 59.  43 grams for women under age 60.  34 grams for women over age 24. You can get the recommended daily intake of dietary fiber by eating a variety of fruits, vegetables, grains, and beans. Your health care provider may also recommend a fiber supplement if it is not possible to get enough fiber through your diet. WHAT DO I NEED TO KNOW ABOUT A HIGH-FIBER DIET?  Fiber supplements have not been widely studied for their effectiveness, so it is better to get fiber through food sources.  Always  check the fiber content on thenutrition facts label of any prepackaged food. Look for foods that contain at least 5 grams of fiber per serving.  Ask your dietitian if you have questions about specific foods that are related to your condition, especially if those foods are not listed in the following section.  Increase your daily fiber consumption gradually. Increasing your intake of dietary fiber too quickly may cause bloating, cramping, or gas.  Drink plenty of water. Water helps you to digest fiber. WHAT  FOODS CAN I EAT? Grains Whole-grain breads. Multigrain cereal. Oats and oatmeal. Brown rice. Barley. Bulgur wheat. [place]. Bran muffins. Popcorn. Rye wafer crackers. Vegetables Sweet potatoes. Spinach. Kale. Artichokes. Cabbage. Broccoli. Green peas. Carrots. Squash. Fruits Berries. Pears. Apples. Oranges. Avocados. Prunes and raisins. Dried figs. Meats and Other Protein Sources Navy, kidney, pinto, and soy beans. Split peas. Lentils. Nuts and seeds. Dairy Fiber-fortified yogurt. Beverages Fiber-fortified soy milk. Fiber-fortified orange juice. Other Fiber bars. The items listed above may not be a complete list of recommended foods or beverages. Contact your dietitian for more options. WHAT FOODS ARE NOT RECOMMENDED? Grains White bread. Pasta made with refined flour. White rice. Vegetables Fried potatoes. Canned vegetables. Well-cooked vegetables.  Fruits Fruit juice. Cooked, strained fruit. Meats and Other Protein Sources Fatty cuts of meat. Fried Sales executive or fried fish. Dairy Milk. Yogurt. Cream cheese. Sour cream. Beverages Soft drinks. Other Cakes and pastries. Butter and oils. The items listed above may not be a complete list of foods and beverages to avoid. Contact your dietitian for more information. WHAT ARE SOME TIPS FOR INCLUDING HIGH-FIBER FOODS IN MY DIET?  Eat a wide variety of high-fiber foods.  Make sure that half of all grains consumed each day are whole grains.  Replace breads and cereals made from refined flour or white flour with whole-grain breads and cereals.  Replace white rice with brown rice, bulgur wheat, or millet.  Start the day with a breakfast that is high in fiber, such as a cereal that contains at least 5 grams of fiber per serving.  Use beans in place of meat in soups, salads, or pasta.  Eat high-fiber snacks, such as berries, raw vegetables, nuts, or popcorn.   This information is not intended to replace advice given to you by your  health care provider. Make sure you discuss any questions you have with your health care provider.   Document Released: 08/18/2005 Document Revised: 09/08/2014 Document Reviewed: 01/31/2014 Elsevier Interactive Patient Education 2016 [employer].   Hemorrhoids Hemorrhoids are swollen veins around the rectum or anus. There are two types of hemorrhoids:   Internal hemorrhoids. These occur in the veins just inside the rectum. They may poke through to the outside and become irritated and painful.  External hemorrhoids. These occur in the veins outside the anus and can be felt as a painful swelling or hard lump near the anus. CAUSES  Pregnancy.   Obesity.   Constipation or diarrhea.   Straining to have a bowel movement.   Sitting for long periods on the toilet.  Heavy lifting or other activity that caused you to strain.  Anal intercourse. SYMPTOMS   Pain.   Anal itching or irritation.   Rectal bleeding.   Fecal leakage.   Anal swelling.   One or more lumps around the anus.  DIAGNOSIS  Your caregiver may be able to diagnose hemorrhoids by visual examination. Other examinations or tests that may be performed include:   Examination of the rectal area with  a gloved hand (digital rectal exam).   Examination of anal canal using a small tube (scope).   A blood test if you have lost a significant amount of blood.  A test to look inside the colon (sigmoidoscopy or colonoscopy). TREATMENT Most hemorrhoids can be treated at home. However, if symptoms do not seem to be getting better or if you have a lot of rectal bleeding, your caregiver may perform a procedure to help make the hemorrhoids get smaller or remove them completely. Possible treatments include:   Placing a rubber band at the base of the hemorrhoid to cut off the circulation (rubber band ligation).   Injecting a chemical to shrink the hemorrhoid (sclerotherapy).   Using a tool to burn the hemorrhoid  (infrared light therapy).   Surgically removing the hemorrhoid (hemorrhoidectomy).   Stapling the hemorrhoid to block blood flow to the tissue (hemorrhoid stapling).  HOME CARE INSTRUCTIONS   Eat foods with fiber, such as whole grains, beans, nuts, fruits, and vegetables. Ask your doctor about taking products with added fiber in them (fibersupplements).  Increase fluid intake. Drink enough water and fluids to keep your urine clear or pale yellow.   Exercise regularly.   Go to the bathroom when you have the urge to have a bowel movement. Do not wait.   Avoid straining to have bowel movements.   Keep the anal area dry and clean. Use wet toilet paper or moist towelettes after a bowel movement.   Medicated creams and suppositories may be used or applied as directed.   Only take over-the-counter or prescription medicines as directed by your caregiver.   Take warm sitz baths for 15-20 minutes, 3-4 times a day to ease pain and discomfort.   Place ice packs on the hemorrhoids if they are tender and swollen. Using ice packs between sitz baths may be helpful.   Put ice in a plastic bag.   Place a towel between your skin and the bag.   Leave the ice on for 15-20 minutes, 3-4 times a day.   Do not use a donut-shaped pillow or sit on the toilet for long periods. This increases blood pooling and pain.  SEEK MEDICAL CARE IF:  You have increasing pain and swelling that is not controlled by treatment or medicine.  You have uncontrolled bleeding.  You have difficulty or you are unable to have a bowel movement.  You have pain or inflammation outside the area of the hemorrhoids. MAKE SURE YOU:  Understand these instructions.  Will watch your condition.  Will get help right away if you are not doing well or get worse.   This information is not intended to replace advice given to you by your health care provider. Make sure you discuss any questions you have with your  health care provider.   Document Released: 08/15/2000 Document Revised: 08/04/2012 Document Reviewed: 06/22/2012 Elsevier Interactive Patient Education [employer].

## 2015-07-10 ENCOUNTER — Encounter (HOSPITAL_COMMUNITY): Payer: Self-pay | Admitting: Internal Medicine

## 2017-03-07 IMAGING — MR MR CERVICAL SPINE W/O CM
5 series · 31 of 48 positions shown · non-contrast
Comparison: MRI of cervical spine 08/27/2008.

ADDENDUM:
Edematous changes are present in the left C3-4 facet joint. This may
contribute to patient's left-sided neck pain.
CLINICAL DATA: Left neck pain extending into left shoulder for 3
months. Cervical radiculopathy.

EXAM:
MRI CERVICAL SPINE WITHOUT CONTRAST
TECHNIQUE: Multiplanar, multisequence MR imaging of the cervical spine was
performed. No intravenous contrast was administered.

[Series 3: T2 · sagittal · 3.0mm · 0.66mm/px · 6 of 12 slices shown (1 of 2)]
[im 1/12]
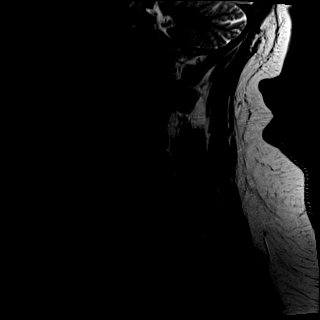
[im 3/12]
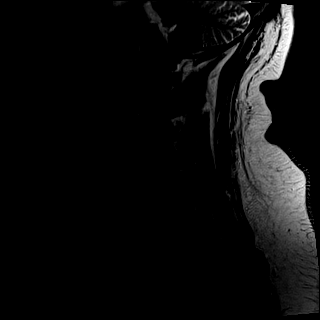
[im 5/12]
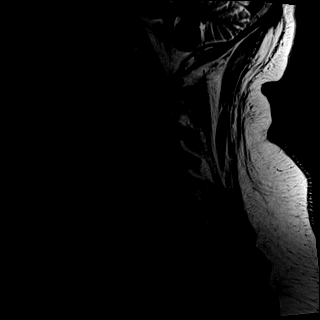
[im 7/12]
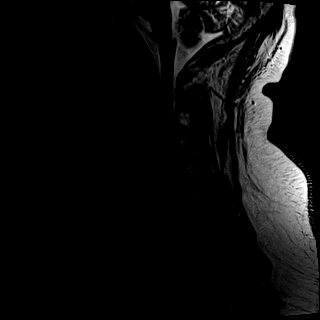
[im 9/12]
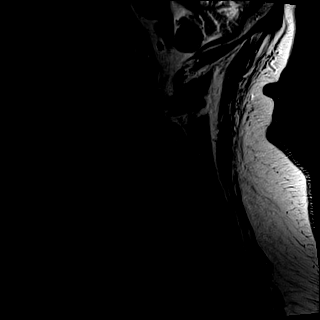
[im 12/12]
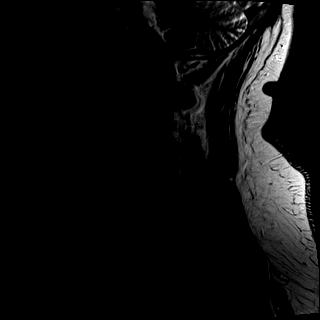

[Series 4: T1 · sagittal · 3.0mm · 0.66mm/px · 6 of 12 slices shown]
[im 1/12]
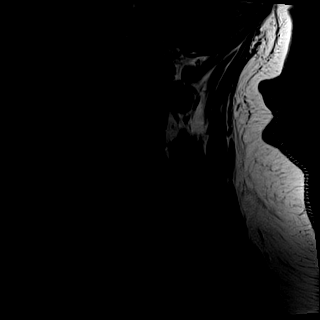
[im 3/12]
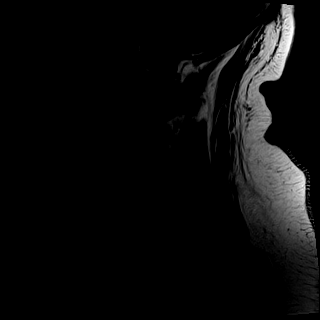
[im 5/12]
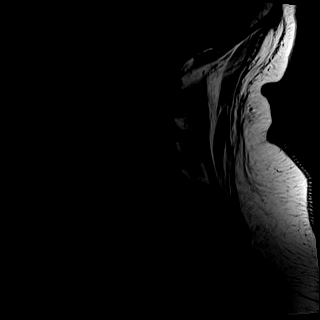
[im 7/12]
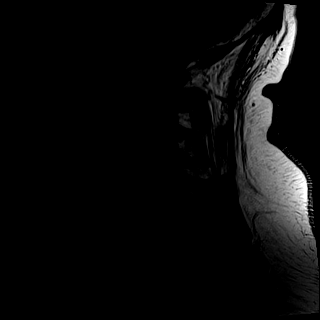
[im 9/12]
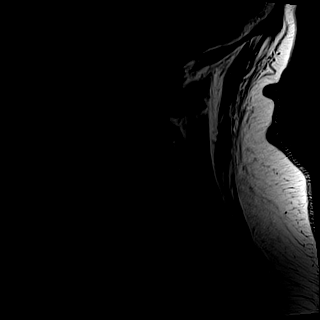
[im 12/12]
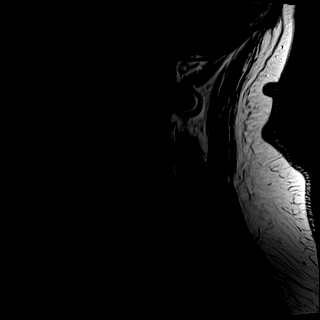

[Series 6: tir sag · sagittal · 3.0mm · 0.41mm/px · 6 of 12 slices shown]
[im 1/12]
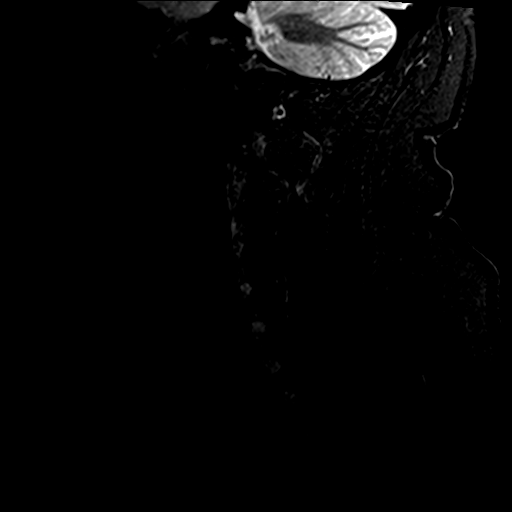
[im 3/12]
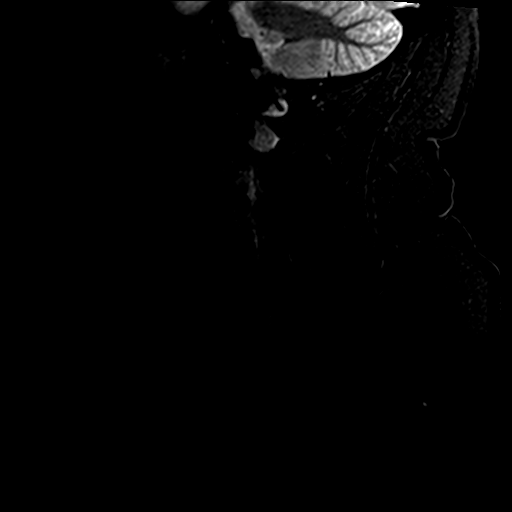
[im 5/12]
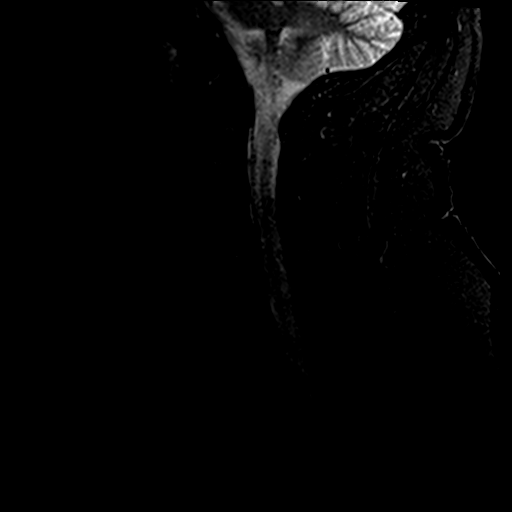
[im 7/12]
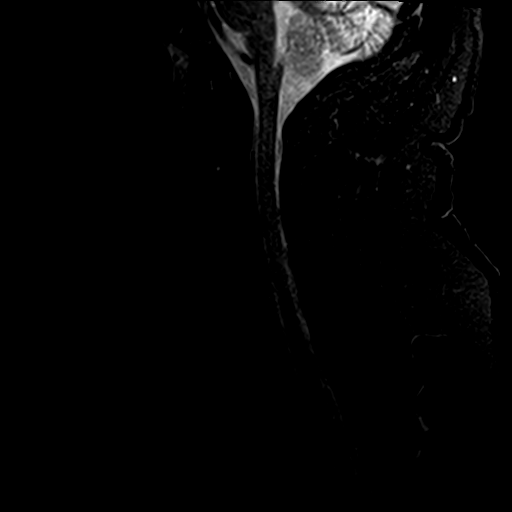
[im 9/12]
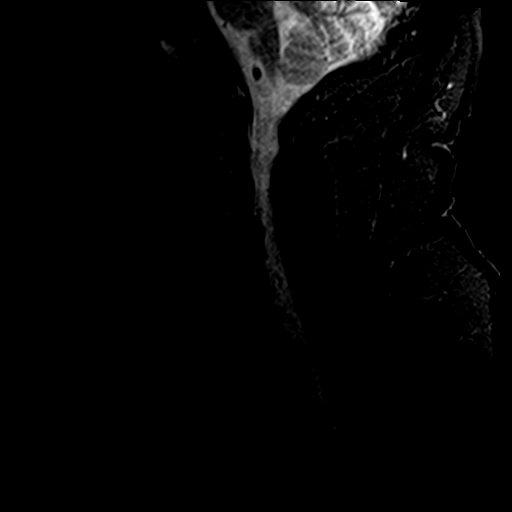
[im 12/12]
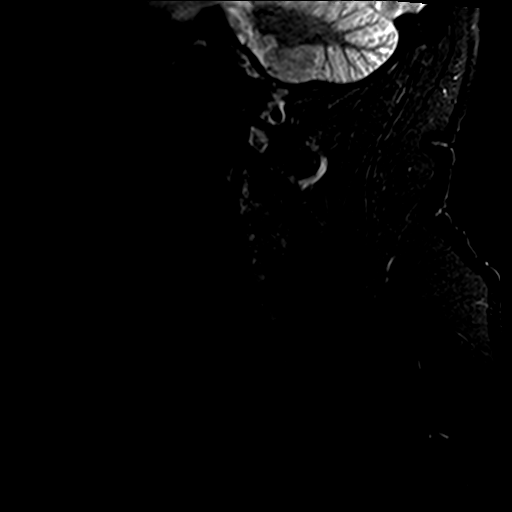

[Series 8: GRE · axial · 3.0mm · 0.35mm/px · z∈[-58,-17]mm · 4 of 28 slices shown]
[im 1/28]
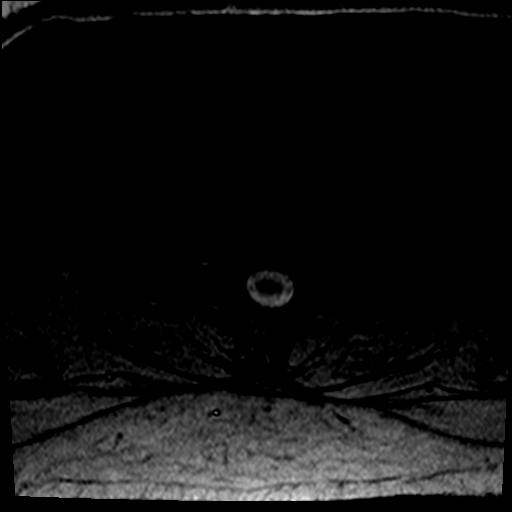
[im 4/28]
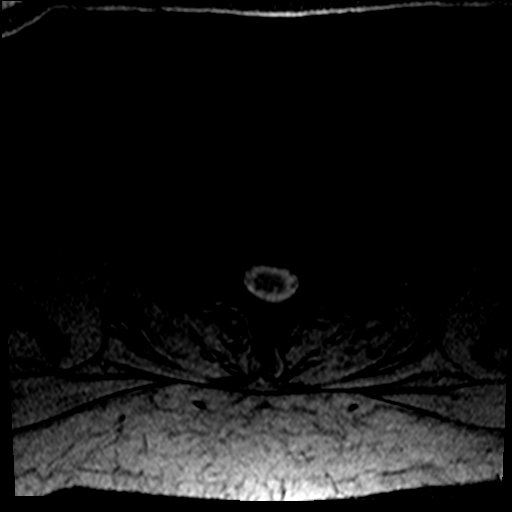
[im 8/28]
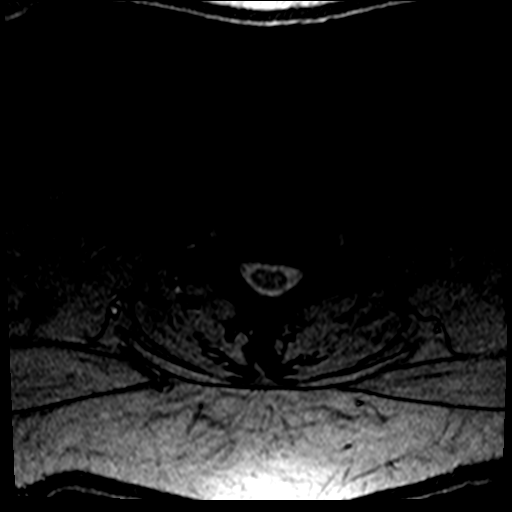
[im 12/28]
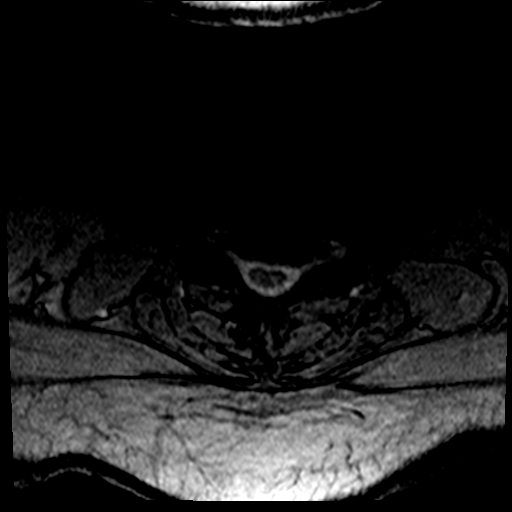

[Series 9: T2 · axial · 3.0mm · 0.56mm/px · z∈[-58,+43]mm · 9 of 28 slices shown (2 of 2)]
[im 1/28]
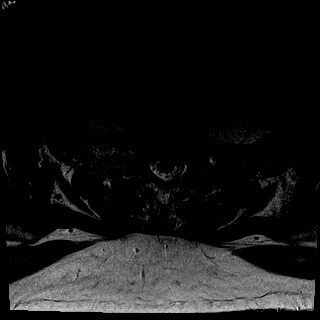
[im 4/28]
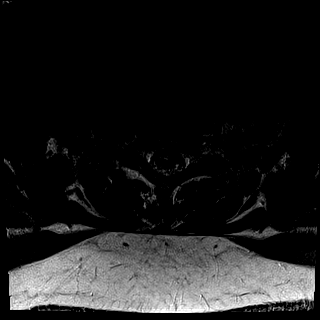
[im 8/28]
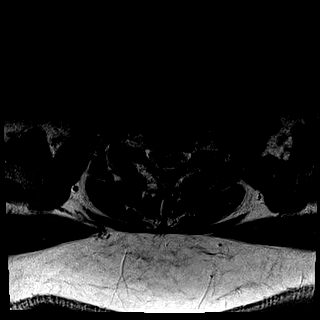
[im 12/28]
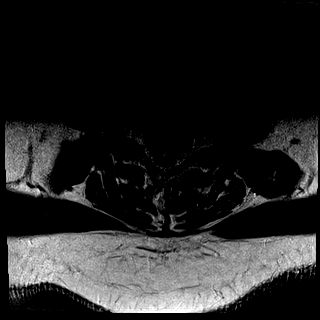
[im 14/28]
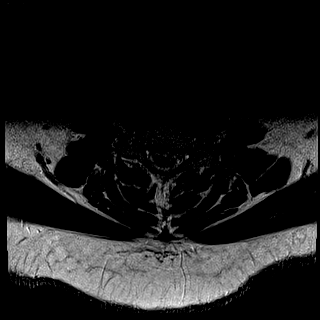
[im 16/28]
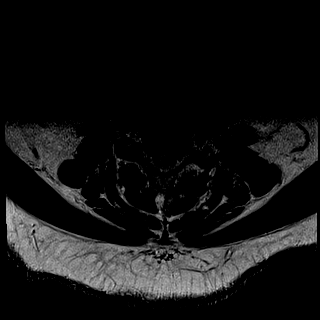
[im 20/28]
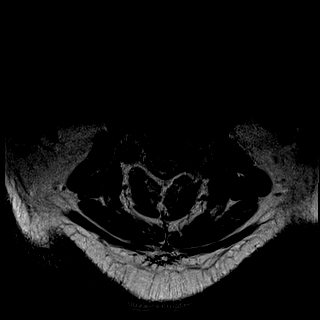
[im 24/28]
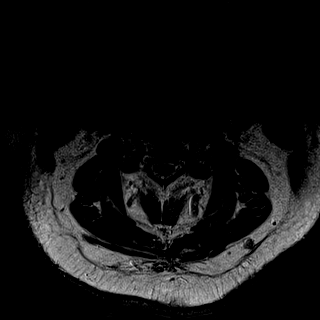
[im 28/28]
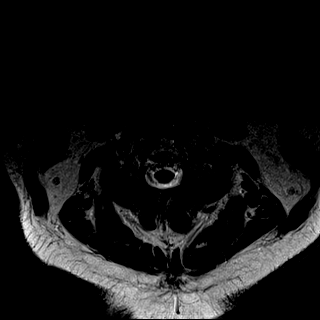

[31 of 48 positions shown; findings below may reference images not displayed]

FINDINGS: Normal signal is present in the cervical and upper thoracic spinal
cord to the lowest imaged level, T2. Marrow signal, vertebral body
heights, alignment are normal. The craniocervical junction is within
normal limits. The visualized intracranial contents are normal.

C2-3: Leftward uncovertebral spurring is new. There is no
significant stenosis.

C3-4: A leftward disc osteophyte complex partially effaces the
ventral CSF. Mild left foraminal stenosis is new.

C4-5: Mild uncovertebral spurring is present bilaterally. There is
some facet hypertrophy. Mild foraminal narrowing is worse on the
left.

C5-6: Mild uncovertebral and facet hypertrophy is present without
significant stenosis.

C6-7: Mild facet hypertrophy is present bilaterally. There is no
significant stenosis.

C7-T1:  Negative.
IMPRESSION: 1. Interval development of mild spondylosis in the cervical spine as
described.
2. And mild left foraminal narrowing at C3-4 is new.
3. Mild foraminal narrowing at C4-5 is new, worse on the left.

## 2017-03-12 ENCOUNTER — Encounter (HOSPITAL_COMMUNITY): Payer: Self-pay | Admitting: Cardiology

## 2017-03-12 ENCOUNTER — Other Ambulatory Visit: Payer: Self-pay

## 2017-03-12 ENCOUNTER — Ambulatory Visit (HOSPITAL_COMMUNITY)
Admission: RE | Admit: 2017-03-12 | Discharge: 2017-03-12 | Disposition: A | Discharge status: Home / Self Care | Payer: BLUE CROSS/BLUE SHIELD | Source: Ambulatory Visit | Attending: Internal Medicine | Admitting: Internal Medicine

## 2017-03-12 ENCOUNTER — Other Ambulatory Visit (HOSPITAL_COMMUNITY): Payer: Self-pay | Admitting: Internal Medicine

## 2017-03-12 ENCOUNTER — Observation Stay (HOSPITAL_COMMUNITY)
Admission: EM | Admit: 2017-03-12 | Discharge: 2017-03-13 | Disposition: A | Discharge status: Home / Self Care | Payer: BLUE CROSS/BLUE SHIELD | Attending: General Surgery | Admitting: General Surgery

## 2017-03-12 DIAGNOSIS — K219 Gastro-esophageal reflux disease without esophagitis: Secondary | ICD-10-CM | POA: Diagnosis not present

## 2017-03-12 DIAGNOSIS — I10 Essential (primary) hypertension: Secondary | ICD-10-CM | POA: Insufficient documentation

## 2017-03-12 DIAGNOSIS — F17210 Nicotine dependence, cigarettes, uncomplicated: Secondary | ICD-10-CM | POA: Diagnosis not present

## 2017-03-12 DIAGNOSIS — R109 Unspecified abdominal pain: Secondary | ICD-10-CM

## 2017-03-12 DIAGNOSIS — J439 Emphysema, unspecified: Secondary | ICD-10-CM | POA: Diagnosis not present

## 2017-03-12 DIAGNOSIS — F419 Anxiety disorder, unspecified: Secondary | ICD-10-CM | POA: Diagnosis not present

## 2017-03-12 DIAGNOSIS — K353 Acute appendicitis with localized peritonitis: Principal | ICD-10-CM | POA: Insufficient documentation

## 2017-03-12 DIAGNOSIS — M1990 Unspecified osteoarthritis, unspecified site: Secondary | ICD-10-CM | POA: Diagnosis not present

## 2017-03-12 DIAGNOSIS — E785 Hyperlipidemia, unspecified: Secondary | ICD-10-CM | POA: Diagnosis not present

## 2017-03-12 DIAGNOSIS — Z7982 Long term (current) use of aspirin: Secondary | ICD-10-CM | POA: Diagnosis not present

## 2017-03-12 DIAGNOSIS — Z720 Tobacco use: Secondary | ICD-10-CM | POA: Diagnosis present

## 2017-03-12 DIAGNOSIS — Z6841 Body Mass Index (BMI) 40.0 and over, adult: Secondary | ICD-10-CM | POA: Insufficient documentation

## 2017-03-12 DIAGNOSIS — K37 Unspecified appendicitis: Secondary | ICD-10-CM | POA: Diagnosis present

## 2017-03-12 DIAGNOSIS — K3580 Unspecified acute appendicitis: Secondary | ICD-10-CM | POA: Diagnosis present

## 2017-03-12 DIAGNOSIS — E7800 Pure hypercholesterolemia, unspecified: Secondary | ICD-10-CM | POA: Diagnosis not present

## 2017-03-12 DIAGNOSIS — Z79899 Other long term (current) drug therapy: Secondary | ICD-10-CM | POA: Insufficient documentation

## 2017-03-12 DIAGNOSIS — J449 Chronic obstructive pulmonary disease, unspecified: Secondary | ICD-10-CM | POA: Diagnosis not present

## 2017-03-12 DIAGNOSIS — K3589 Other acute appendicitis: Secondary | ICD-10-CM | POA: Diagnosis not present

## 2017-03-12 LAB — CBC WITH DIFFERENTIAL/PLATELET
Basophils Absolute: 0 10*3/uL (ref 0.0–0.1)
Basophils Relative: 0 %
Eosinophils Absolute: 0.5 10*3/uL (ref 0.0–0.7)
Eosinophils Relative: 4 %
HCT: 43.6 % (ref 39.0–52.0)
Hemoglobin: 14.7 g/dL (ref 13.0–17.0)
Lymphocytes Relative: 30 %
Lymphs Abs: 3.8 10*3/uL (ref 0.7–4.0)
MCH: 31.5 pg (ref 26.0–34.0)
MCHC: 33.7 g/dL (ref 30.0–36.0)
MCV: 93.6 fL (ref 78.0–100.0)
Monocytes Absolute: 0.9 10*3/uL (ref 0.1–1.0)
Monocytes Relative: 7 %
Neutro Abs: 7.7 10*3/uL (ref 1.7–7.7)
Neutrophils Relative %: 59 %
Platelets: 258 10*3/uL (ref 150–400)
RBC: 4.66 MIL/uL (ref 4.22–5.81)
RDW: 12.7 % (ref 11.5–15.5)
WBC: 12.9 10*3/uL — ABNORMAL HIGH (ref 4.0–10.5)

## 2017-03-12 LAB — BASIC METABOLIC PANEL
Anion gap: 14 (ref 5–15)
BUN: 12 mg/dL (ref 6–20)
CO2: 24 mmol/L (ref 22–32)
Calcium: 8.8 mg/dL — ABNORMAL LOW (ref 8.9–10.3)
Chloride: 98 mmol/L — ABNORMAL LOW (ref 101–111)
Creatinine, Ser: 0.85 mg/dL (ref 0.61–1.24)
GFR calc Af Amer: >60 mL/min (ref >60)
GFR calc non Af Amer: >60 mL/min (ref >60)
Glucose, Bld: 103 mg/dL — ABNORMAL HIGH (ref 65–99)
Potassium: 3.8 mmol/L (ref 3.5–5.1)
Sodium: 136 mmol/L (ref 135–145)

## 2017-03-12 MED ORDER — MOMETASONE FURO-FORMOTEROL FUM 200-5 MCG/ACT IN AERO
2.0000 | INHALATION_SPRAY | Freq: Two times a day (BID) | RESPIRATORY_TRACT | Status: DC
  Filled 2017-03-12 (×2): qty 8.8

## 2017-03-12 MED ORDER — MORPHINE SULFATE (PF) 4 MG/ML IV SOLN
4.0000 mg | INTRAVENOUS | Status: DC | PRN
  Administered 2017-03-12: 4 mg via INTRAVENOUS
  Filled 2017-03-12: qty 1

## 2017-03-12 MED ORDER — METRONIDAZOLE IN NACL 5-0.79 MG/ML-% IV SOLN: 500.0000 mg | Freq: Three times a day (TID) | INTRAVENOUS | Status: DC

## 2017-03-12 MED ORDER — DEXTROSE-NACL 5-0.45 % IV SOLN
INTRAVENOUS | Status: DC
  Administered 2017-03-13: via INTRAVENOUS

## 2017-03-12 MED ORDER — PIPERACILLIN-TAZOBACTAM 3.375 G IVPB
3.3750 g | Freq: Three times a day (TID) | INTRAVENOUS | Status: DC
  Administered 2017-03-13 (×2): 3.375 g via INTRAVENOUS
  Filled 2017-03-12: qty 50

## 2017-03-12 MED ORDER — HYDRALAZINE HCL 20 MG/ML IJ SOLN: 10.0000 mg | INTRAMUSCULAR | Status: DC | PRN

## 2017-03-12 MED ORDER — HYDROCODONE-ACETAMINOPHEN 5-325 MG PO TABS: 1.0000 | ORAL_TABLET | ORAL | Status: DC | PRN

## 2017-03-12 MED ORDER — DEXTROSE 5 % IV SOLN: 2.0000 g | INTRAVENOUS | Status: DC

## 2017-03-12 MED ORDER — CEFTRIAXONE SODIUM 1 G IJ SOLR: 1.0000 g | INTRAMUSCULAR | Status: DC

## 2017-03-12 MED ORDER — SIMVASTATIN 20 MG PO TABS: 20.0000 mg | ORAL_TABLET | Freq: Every day | ORAL | Status: DC

## 2017-03-12 MED ORDER — SUCCINYLCHOLINE CHLORIDE 20 MG/ML IJ SOLN
INTRAMUSCULAR | Status: AC
  Filled 2017-03-12: qty 2

## 2017-03-12 MED ORDER — BUPIVACAINE HCL (PF) 0.5 % IJ SOLN
INTRAMUSCULAR | Status: AC
  Filled 2017-03-12: qty 30

## 2017-03-12 MED ORDER — AMLODIPINE BESYLATE 5 MG PO TABS: 5.0000 mg | ORAL_TABLET | Freq: Every day | ORAL | Status: DC

## 2017-03-12 MED ORDER — FLUTICASONE PROPIONATE HFA 44 MCG/ACT IN AERO: 1.0000 | INHALATION_SPRAY | Freq: Two times a day (BID) | RESPIRATORY_TRACT | Status: DC

## 2017-03-12 MED ORDER — MIDAZOLAM HCL 2 MG/2ML IJ SOLN
INTRAMUSCULAR | Status: AC
  Filled 2017-03-12: qty 2

## 2017-03-12 MED ORDER — PANTOPRAZOLE SODIUM 40 MG PO TBEC
40.0000 mg | DELAYED_RELEASE_TABLET | Freq: Every day | ORAL | Status: DC
  Administered 2017-03-13: 40 mg via ORAL
  Filled 2017-03-12: qty 1

## 2017-03-12 MED ORDER — ONDANSETRON 4 MG PO TBDP
4.0000 mg | ORAL_TABLET | Freq: Four times a day (QID) | ORAL | Status: DC | PRN
Start: 2017-03-12 — End: 2017-03-13

## 2017-03-12 MED ORDER — ENOXAPARIN SODIUM 40 MG/0.4ML ~~LOC~~ SOLN: 40.0000 mg | SUBCUTANEOUS | Status: DC

## 2017-03-12 MED ORDER — ONDANSETRON HCL 4 MG/2ML IJ SOLN
INTRAMUSCULAR | Status: AC
  Filled 2017-03-12: qty 2

## 2017-03-12 MED ORDER — DEXTROSE 5 % IV SOLN
2.0000 g | Freq: Once | INTRAVENOUS | Status: AC
  Administered 2017-03-12: 2 g via INTRAVENOUS
  Filled 2017-03-12: qty 2

## 2017-03-12 MED ORDER — MORPHINE SULFATE (PF) 2 MG/ML IV SOLN: 2.0000 mg | INTRAVENOUS | Status: DC | PRN

## 2017-03-12 MED ORDER — NICOTINE 21 MG/24HR TD PT24: 21.0000 mg | MEDICATED_PATCH | Freq: Every day | TRANSDERMAL | Status: DC

## 2017-03-12 MED ORDER — IOPAMIDOL (ISOVUE-300) INJECTION 61%
100.0000 mL | Freq: Once | INTRAVENOUS | Status: AC | PRN
  Administered 2017-03-12: 100 mL via INTRAVENOUS

## 2017-03-12 MED ORDER — SODIUM CHLORIDE 0.9 % IV SOLN: INTRAVENOUS | Status: DC

## 2017-03-12 MED ORDER — MORPHINE SULFATE (PF) 4 MG/ML IV SOLN
2.0000 mg | INTRAVENOUS | Status: DC | PRN
  Administered 2017-03-12 – 2017-03-13 (×2): 4 mg via INTRAVENOUS
  Filled 2017-03-12 (×2): qty 1

## 2017-03-12 MED ORDER — ROCURONIUM BROMIDE 50 MG/5ML IV SOLN
INTRAVENOUS | Status: AC
  Filled 2017-03-12: qty 1

## 2017-03-12 MED ORDER — ACETAMINOPHEN 325 MG PO TABS: 650.0000 mg | ORAL_TABLET | Freq: Four times a day (QID) | ORAL | Status: DC | PRN

## 2017-03-12 MED ORDER — ONDANSETRON HCL 4 MG/2ML IJ SOLN: 4.0000 mg | Freq: Four times a day (QID) | INTRAMUSCULAR | Status: DC | PRN

## 2017-03-12 MED ORDER — PROPOFOL 10 MG/ML IV BOLUS
INTRAVENOUS | Status: AC
  Filled 2017-03-12: qty 40

## 2017-03-12 MED ORDER — ONDANSETRON HCL 4 MG/2ML IJ SOLN
4.0000 mg | Freq: Four times a day (QID) | INTRAMUSCULAR | Status: DC | PRN
Start: 2017-03-12 — End: 2017-03-13

## 2017-03-12 MED ORDER — METRONIDAZOLE IN NACL 5-0.79 MG/ML-% IV SOLN
500.0000 mg | Freq: Once | INTRAVENOUS | Status: AC
  Administered 2017-03-12: 500 mg via INTRAVENOUS
  Filled 2017-03-12: qty 100

## 2017-03-12 MED ORDER — FENTANYL CITRATE (PF) 250 MCG/5ML IJ SOLN
INTRAMUSCULAR | Status: AC
  Filled 2017-03-12: qty 5

## 2017-03-12 MED ORDER — ONDANSETRON HCL 4 MG PO TABS
4.0000 mg | ORAL_TABLET | Freq: Four times a day (QID) | ORAL | Status: DC | PRN
Start: 2017-03-12 — End: 2017-03-13

## 2017-03-12 MED ORDER — SODIUM CHLORIDE 0.9 % IV SOLN
INTRAVENOUS | Status: DC
  Administered 2017-03-12: 20:00:00 via INTRAVENOUS

## 2017-03-12 MED ORDER — ALBUTEROL SULFATE (2.5 MG/3ML) 0.083% IN NEBU
2.5000 mg | INHALATION_SOLUTION | RESPIRATORY_TRACT | Status: DC | PRN
  Filled 2017-03-12: qty 3

## 2017-03-12 MED ORDER — LIDOCAINE HCL (PF) 1 % IJ SOLN
INTRAMUSCULAR | Status: AC
  Filled 2017-03-12: qty 5

## 2017-03-12 NOTE — ED Triage Notes (Signed)
RLQ abdominal pain times 4 days. Had OP CT scan today that showed acute appendicitis.

## 2017-03-12 NOTE — ED Notes (Signed)
Dr Davis at bedside.

## 2017-03-12 NOTE — ED Notes (Signed)
ED Provider at bedside. 

## 2017-03-12 NOTE — ED Notes (Signed)
Patient belongings collected and placed in patient bag with sticker.

## 2017-03-12 NOTE — ED Provider Notes (Signed)
[facility] DEPT Provider Note   CSN: [id number] Arrival date & time: 03/12/17  1821     History   Chief Complaint Chief Complaint  Patient presents with  . Abdominal Pain    HPI Brandon Nelson is a 56 y.o. male.  HPI   56 year old male referred to the emergency room after an outpatient CT which showed acute appendicitis. No perforation or abscess. Patient reports about 4 day history of lower abdominal/RLQ pain. Fairly minimal at rest. Worse with movement and ambulation. No fever. Mild anorexia. Currently says it's just a mild ache and says he mainly feels "bubbly" which he attributes to oral contrast.   Past Medical History:  Diagnosis Date  . Anxiety   . Arthritis   . Chronic kidney disease    kidney stones  . COPD (chronic obstructive pulmonary disease) ([place])   . GERD (gastroesophageal reflux disease)   . Hyperlipidemia   . Hypertension   . Shortness of breath    exertion    Patient Active Problem List   Diagnosis Date Noted  . History of COPD 05/25/2015  . Essential hypertension 05/25/2015  . High cholesterol 05/25/2015  . Arthritis 05/25/2015  . Leg pain 10/03/2013    Past Surgical History:  Procedure Laterality Date  . ANAL FISTULOTOMY    . COLONOSCOPY N/A 07/05/2015   Procedure: COLONOSCOPY;  Surgeon: Rogene Houston, MD;  [facility]: [facility];  Service: Endoscopy;  Laterality: N/A;  11:55  . CYSTOSCOPY    . CYSTOSCOPY W/ RETROGRADES Right 07/06/2013   Procedure: CYSTOSCOPY WITH RETROGRADE PYELOGRAM;  Surgeon: [name], MD;  Location: [facility];  Service: Urology;  Laterality: Right;  . left knee arthroscopy    . right knee arthroscopy    . right tendon foot    . STONE EXTRACTION WITH BASKET Right 07/06/2013   Procedure: STONE EXTRACTION WITH BASKET;  Surgeon: [name], MD;  Location: [facility];  Service: Urology;  Laterality: Right;  . URETEROSCOPY Right 07/06/2013   Procedure: URETEROSCOPY;  Surgeon: [name], MD;  Location:  [facility];  Service: Urology;  Laterality: Right;       Home Medications    Prior to Admission medications   Medication Sig Start Date End Date Taking? Authorizing Provider  acetaminophen (TYLENOL) 500 MG tablet Take 500 mg by mouth every 6 (six) hours as needed for mild pain.     [provider]  albuterol (PROVENTIL HFA;VENTOLIN HFA) 108 (90 BASE) MCG/ACT inhaler Inhale 2 puffs into the lungs.    [provider]  amLODipine (NORVASC) 5 MG tablet Take 5 mg by mouth daily.    [provider]  aspirin 81 MG tablet Take 81 mg by mouth daily.    [provider]  azelastine (ASTELIN) 0.1 % nasal spray Place 1 spray into the nose.    [provider]  budesonide-formoterol (SYMBICORT) 160-4.5 MCG/ACT inhaler Inhale 2 puffs into the lungs 2 (two) times daily.    [provider]  buPROPion (WELLBUTRIN SR) 100 MG 12 hr tablet Take 100 mg by mouth 2 (two) times daily.    [provider]  diazepam (VALIUM) 10 MG tablet Take 10 mg by mouth as needed for anxiety.    [provider]  docusate sodium (COLACE) 100 MG capsule Take 2 capsules (200 mg total) by mouth at bedtime. 07/05/15   [name], Mechele Dawley, MD  fluticasone (FLOVENT DISKUS) 50 MCG/BLIST diskus inhaler Inhale 1 puff into the lungs.  [provider]  gabapentin (NEURONTIN) 300 MG capsule Take 300 mg by mouth daily.    [provider]  HYDROcodone-acetaminophen (NORCO) 10-325 MG per tablet Take 1 tablet by mouth every 6 (six) hours as needed for moderate pain.     [provider]  lisinopril (PRINIVIL,ZESTRIL) 20 MG tablet Take 20 mg by mouth daily.    [provider]  meloxicam (MOBIC) 7.5 MG tablet Take 15 mg by mouth at bedtime.     [provider]  pantoprazole (PROTONIX) 40 MG tablet Take 40 mg by mouth daily.    [provider]  simvastatin (ZOCOR) 20 MG tablet Take 20 mg by mouth daily.    [provider]    Wheat Dextrin (BENEFIBER DRINK MIX) PACK Take 4 g by mouth at bedtime. 07/05/15   Rogene Houston, MD    Family History History reviewed. No pertinent family history.  Social History Social History  Substance Use Topics  . Smoking status: Heavy Tobacco Smoker    Packs/day: 2.00    Years: 40.00    Types: Cigarettes  . Smokeless tobacco: Not on file  . Alcohol use Yes     Comment: beer occasional     Allergies   Patient has no known allergies.   Review of Systems Review of Systems  All systems reviewed and negative, other than as noted in HPI.   Physical Exam Updated Vital Signs BP 134/77   Pulse 86   Temp 98.4 F (36.9 C) (Oral)   Resp 18   Ht 5\' 7"  (1.702 m)   Wt 123.8 kg (273 lb)   SpO2 95%   BMI 42.76 kg/m   Physical Exam  Constitutional: He appears well-developed and well-nourished. No distress.  Well appearing obese male sitting up in bed.   HENT:  Head: Normocephalic and atraumatic.  Eyes: Conjunctivae are normal. Right eye exhibits no discharge. Left eye exhibits no discharge.  Neck: Neck supple.  Cardiovascular: Normal rate, regular rhythm and normal heart sounds.  Exam reveals no gallop and no friction rub.   No murmur heard. Pulmonary/Chest: Effort normal and breath sounds normal. No respiratory distress.  Abdominal: Soft. He exhibits no distension. There is tenderness.  Suprapubic/RLQ and RUQ tenderness. No rebound/guarding. Obese, doesn't seem distended.   Musculoskeletal: He exhibits no edema or tenderness.  Neurological: He is alert.  Skin: Skin is warm and dry.  Psychiatric: He has a normal mood and affect. His behavior is normal. Thought content normal.  Nursing note and vitals reviewed.    ED Treatments / Results  Labs (all labs ordered are listed, but only abnormal results are displayed) Labs Reviewed  CBC WITH DIFFERENTIAL/PLATELET  BASIC METABOLIC PANEL    EKG  EKG Interpretation None       Radiology Ct Abdomen Pelvis  W Contrast  Result Date: 03/12/2017 CLINICAL DATA:  Right lower quadrant pain, nausea, and vomiting for 3 days. EXAM: CT ABDOMEN AND PELVIS WITH CONTRAST TECHNIQUE: Multidetector CT imaging of the abdomen and pelvis was performed using the standard protocol following bolus administration of intravenous contrast. CONTRAST:  160mL ISOVUE-300 IOPAMIDOL (ISOVUE-300) INJECTION 61% COMPARISON:  10/25/2014 FINDINGS: Lower Chest: No acute findings. Hepatobiliary: No masses identified. Moderate hepatic steatosis again noted. Gallbladder is unremarkable. Pancreas:  No mass or inflammatory changes. Spleen: Within normal limits in size and appearance. Adrenals/Urinary Tract: No masses identified. No evidence of hydronephrosis. Stomach/Bowel: Wall thickening of the distal appendix and mild periappendiceal inflammatory changes are consistent with acute appendicitis.  No evidence of abscess or bowel obstruction. Sigmoid diverticulosis is demonstrated, without evidence of acute diverticulitis. Vascular/Lymphatic: No pathologically enlarged lymph nodes. No abdominal aortic aneurysm. Reproductive:  No mass or other significant abnormality. Other:  None. Musculoskeletal:  No suspicious bone lesions identified. IMPRESSION: Positive for acute appendicitis. No evidence of abscess or other complication. Colonic diverticulosis, without radiographic evidence of diverticulitis. Hepatic steatosis. Electronically Signed   By: Earle Gell M.D.   On: 03/12/2017 18:09    Procedures Procedures (including critical care time)  Medications Ordered in ED Medications  cefTRIAXone (ROCEPHIN) 2 g in dextrose 5 % 50 mL IVPB (not administered)    And  metroNIDAZOLE (FLAGYL) IVPB 500 mg (not administered)  0.9 %  sodium chloride infusion (not administered)     Initial Impression / Assessment and Plan / ED Course  I have reviewed the triage vital signs and the nursing notes.  Pertinent labs & imaging results that were available during my  care of the patient were reviewed by me and considered in my medical decision making (see chart for details).     55yM with abdominal pain and CT today with appendicitis w/o complicating features. Last ate around 1200 today. Not anticoagulated.   IVF. NPO. Basic pre-op labs and EKG. Rocephin/flagyl. Declining pain medication initially. PRN morphine ordered. Discussed with Dr Rosana Hoes, surgery. Appreciate his assistance. Pt updated.  Final Clinical Impressions(s) / ED Diagnoses   Final diagnoses:  Acute appendicitis, unspecified acute appendicitis type    New Prescriptions New Prescriptions   No medications on file     Virgel Manifold, MD 03/12/17 1914

## 2017-03-12 NOTE — Anesthesia Preprocedure Evaluation (Addendum)
Anesthesia Evaluation  Patient identified by MRN, date of birth, ID band Patient awake    Airway Mallampati: III  TM Distance: >3 FB Neck ROM: Full    Dental  (+) Teeth Intact   Pulmonary shortness of breath and with exertion, COPD, Current Smoker,     + wheezing      Cardiovascular hypertension, Pt. on medications + DOE   Rhythm:Regular     Neuro/Psych Anxiety    GI/Hepatic GERD  Medicated,  Endo/Other  Morbid obesity  Renal/GU Renal diseaseKidney Disease     Musculoskeletal  (+) Arthritis ,   Abdominal (+) + obese,   Peds  Hematology   Anesthesia Other Findings   Reproductive/Obstetrics                            Anesthesia Physical Anesthesia Plan  ASA: III and emergent  Anesthesia Plan: General   Post-op Pain Management:    Induction: Intravenous, Rapid sequence and Cricoid pressure planned  PONV Risk Score and Plan:   Airway Management Planned: Oral ETT  Additional Equipment:   Intra-op Plan:   Post-operative Plan: Extubation in OR  Informed Consent: I have reviewed the patients History and Physical, chart, labs and discussed the procedure including the risks, benefits and alternatives for the proposed anesthesia with the patient or authorized representative who has indicated his/her understanding and acceptance.     Plan Discussed with:   Anesthesia Plan Comments:        Anesthesia Quick Evaluation

## 2017-03-12 NOTE — Consult Note (Signed)
Patient seen and examined.  Patient is 2 ppd smoker with COPD; Wheezing noted; Although patient has good mouth opening,, his neck is short with large circumference and he has beard; potential for difficult airway and difficult mask; discussed with Dr. Rosana Hoes and heis in agreement to proceed first thing in AM when full anesthesia team is present.  Discussed with patient and he verbalized understanding;

## 2017-03-12 NOTE — Consult Note (Signed)
Medical Consultation   Brandon Nelson  XVQ:[id number]  DOB: 01/30/1961  DOA: 03/12/2017  PCP: Sharilyn Sites, MD   Outpatient Specialists: Dr. Laural Golden (GI), Dr. Remer Macho (cardiology)    Requesting physician: Dr. Rosana Hoes, surgery   Reason for consultation: Perioperative management of medical comorbidities  History of Present Illness: Brandon Nelson is an 56 y.o. male with history of emphysema, hypertension, hyperlipidemia, and GERD, now presenting to the emergency department at the direction of his PCP for evaluation of intermittent right lower quadrant abdominal pain with palpation CT concerning for acute appendicitis. Patient reports that he had been in his usual state of health until developing the insidious onset of crampy lower abdominal pain approximately 4 days ago. Since that time, he has had recurrent episodes of this. He describes the pain as constant, mild while at rest, severe with movement, localized to the right lower quadrant, associated with anorexia. He denies any associated fevers or chills and denies chest pain, palpitations, or increase in his chronic cough and exertional dyspnea. Patient denies any significant lower extremity swelling or orthopnea. No recent headache, change in vision or hearing, or focal numbness or weakness. Reports that his COPD has been stable, but he continues to smoke approximately 2 packs per day. Reports that hypertension has been well controlled. No vomiting or diarrhea.  Patient was evaluated by his PCP for these complaints, CT the abdomen and pelvis was obtained in the outpatient setting, and findings were concerning for acute appendicitis, prompting him to be directed to the ED. Patient is afebrile on presentation with vital signs stable. EKG demonstrates sinus rhythm with low-voltage QRS. Chemistry panel was unremarkable and CBC is notable for a leukocytosis to 12,900. Patient was given normal saline infusion, and empiric Rocephin  and Flagyl, and analgesia with IV morphine. Surgeon was consulted by the ED physician, plans to take the patient back to the OR this evening, and requests a medical consultation for perioperative management of the patient's comorbidities.  Review of Systems:  ROS As per HPI otherwise 12 point review of systems negative.    Past Medical History: Past Medical History:  Diagnosis Date  . Anxiety   . Arthritis   . Chronic kidney disease    kidney stones  . COPD (chronic obstructive pulmonary disease) ([place])   . GERD (gastroesophageal reflux disease)   . Hyperlipidemia   . Hypertension   . Shortness of breath    exertion    Past Surgical History: Past Surgical History:  Procedure Laterality Date  . ANAL FISTULOTOMY    . COLONOSCOPY N/A 07/05/2015   Procedure: COLONOSCOPY;  Surgeon: Rogene Houston, MD;  [facility];  Service: Endoscopy;  Laterality: N/A;  11:55  . CYSTOSCOPY    . CYSTOSCOPY W/ RETROGRADES Right 07/06/2013   Procedure: CYSTOSCOPY WITH RETROGRADE PYELOGRAM;  Surgeon: [name], MD;  Location: [facility];  Service: Urology;  Laterality: Right;  . left knee arthroscopy    . right knee arthroscopy    . right tendon foot    . STONE EXTRACTION WITH BASKET Right 07/06/2013   Procedure: STONE EXTRACTION WITH BASKET;  Surgeon: [name], MD;  Location: [facility];  Service: Urology;  Laterality: Right;  . URETEROSCOPY Right 07/06/2013   Procedure: URETEROSCOPY;  Surgeon: [name], MD;  Location: [facility];  Service: Urology;  Laterality: Right;     Allergies:  No Known Allergies  Social History:  reports that he has been smoking Cigarettes.  He has a 80.00 pack-year smoking history. He does not have any smokeless tobacco history on file. He reports that he drinks alcohol. He reports that he does not use drugs.   Family History: History reviewed. No pertinent family history.    Physical Exam: Vitals:   03/12/17 1832 03/12/17 1834  BP:   134/77  Pulse:  86  Resp:  18  Temp:  98.4 F (36.9 C)  TempSrc:  Oral  SpO2:  95%  Weight: 123.8 kg (273 lb)   Height: 5\' 7"  (1.702 m)     Constitutional: Alert and awake, oriented x3, in apparent discomfort with movement, not in any acute distress. Eyes: PERLA, EOMI, irises appear normal, anicteric sclera,  ENMT: external ears and nose appear normal, Lips, oropharynx mucosa, posterior pharynx appear normal  Neck: neck appears normal, no masses, normal ROM, no thyromegaly, no JVD  CVS: S1-S2 clear, no murmur rubs or gallops, no LE edema, normal pedal pulses  Respiratory:  clear to auscultation bilaterally, no wheezing, rales or rhonchi. Respiratory effort normal. No accessory muscle use.  Abdomen: soft, nondistended, normal bowel sounds, tender in RLQ, no rebound pain or guarding.  Musculoskeletal: : no cyanosis, clubbing or edema noted bilaterally  Neuro: Cranial nerves II-XII intact, strength, sensation, reflexes Psych: judgement and insight appear normal, stable mood and affect, mental status Skin: no rashes or lesions or ulcers, no induration or nodules   Data reviewed:  I have personally reviewed following labs and imaging studies Labs:  CBC:  Recent Labs Lab 03/12/17 1858  WBC 12.9*  NEUTROABS 7.7  HGB 14.7  HCT 43.6  MCV 93.6  PLT 161    Basic Metabolic Panel:  Recent Labs Lab 03/12/17 1858  NA 136  K 3.8  CL 98*  CO2 24  GLUCOSE 103*  BUN 12  CREATININE 0.85  CALCIUM 8.8*   GFR Estimated Creatinine Clearance: 121 mL/min (by C-G formula based on SCr of 0.85 mg/dL). Liver Function Tests: No results for input(s): AST, ALT, ALKPHOS, BILITOT, PROT, ALBUMIN in the last 168 hours. No results for input(s): LIPASE, AMYLASE in the last 168 hours. No results for input(s): AMMONIA in the last 168 hours. Coagulation profile No results for input(s): INR, PROTIME in the last 168 hours.  Cardiac Enzymes: No results for input(s): CKTOTAL, CKMB, CKMBINDEX,  TROPONINI in the last 168 hours. BNP: Invalid input(s): POCBNP CBG: No results for input(s): GLUCAP in the last 168 hours. D-Dimer No results for input(s): DDIMER in the last 72 hours. Hgb A1c No results for input(s): HGBA1C in the last 72 hours. Lipid Profile No results for input(s): CHOL, HDL, LDLCALC, TRIG, CHOLHDL, LDLDIRECT in the last 72 hours. Thyroid function studies No results for input(s): TSH, T4TOTAL, T3FREE, THYROIDAB in the last 72 hours.  Invalid input(s): FREET3 Anemia work up No results for input(s): VITAMINB12, FOLATE, FERRITIN, TIBC, IRON, RETICCTPCT in the last 72 hours. Urinalysis No results found for: COLORURINE, APPEARANCEUR, LABSPEC, [place], GLUCOSEU, HGBUR, BILIRUBINUR, KETONESUR, PROTEINUR, UROBILINOGEN, NITRITE, [place]   Microbiology No results found for this or any previous visit (from the past 240 hour(s)).     Inpatient Medications:   Scheduled Meds: . [START ON 03/13/2017] amLODipine  5 mg Oral Daily  . mometasone-formoterol  2 puff Inhalation BID  . nicotine  21 mg Transdermal Daily  . [START ON 03/13/2017] pantoprazole  40 mg Oral Daily  . simvastatin  20 mg Oral q1800   Continuous  Infusions: . sodium chloride 150 mL/hr at 03/12/17 1930  . metronidazole 500 mg (03/12/17 1928)     Radiological Exams on Admission: Ct Abdomen Pelvis W Contrast  Result Date: 03/12/2017 CLINICAL DATA:  Right lower quadrant pain, nausea, and vomiting for 3 days. EXAM: CT ABDOMEN AND PELVIS WITH CONTRAST TECHNIQUE: Multidetector CT imaging of the abdomen and pelvis was performed using the standard protocol following bolus administration of intravenous contrast. CONTRAST:  139mL ISOVUE-300 IOPAMIDOL (ISOVUE-300) INJECTION 61% COMPARISON:  10/25/2014 FINDINGS: Lower Chest: No acute findings. Hepatobiliary: No masses identified. Moderate hepatic steatosis again noted. Gallbladder is unremarkable. Pancreas:  No mass or inflammatory changes. Spleen: Within normal  limits in size and appearance. Adrenals/Urinary Tract: No masses identified. No evidence of hydronephrosis. Stomach/Bowel: Wall thickening of the distal appendix and mild periappendiceal inflammatory changes are consistent with acute appendicitis. No evidence of abscess or bowel obstruction. Sigmoid diverticulosis is demonstrated, without evidence of acute diverticulitis. Vascular/Lymphatic: No pathologically enlarged lymph nodes. No abdominal aortic aneurysm. Reproductive:  No mass or other significant abnormality. Other:  None. Musculoskeletal:  No suspicious bone lesions identified. IMPRESSION: Positive for acute appendicitis. No evidence of abscess or other complication. Colonic diverticulosis, without radiographic evidence of diverticulitis. Hepatic steatosis. Electronically Signed   By: Earle Gell M.D.   On: 03/12/2017 18:09    Impression/Recommendations  1. Acute appendicitis  - Pt presents at direction of PCP after CT abd/pelvis, obtained in setting of 4 days RLQ pain, demonstrates acute appendicitis  - Pt is afebrile, hemodynamically stable - Treated with empiric abx and IVF in ED  - Surgery planning for operative management this evening   2. COPD  - Pt reports this to be stable with no recent increase in his chronic DOE and occasional cough  - No cough or wheeze on exam - Continue scheduled ICS/LABA, prn albuterol nebs, supplemental O2 prn     3. Hypertension - BP is at goal  - Managed at home with Norvasc and lisinopril  - Continue Norvasc, resume lisinopril as appropriate after surgery   - Hydralazine IVP's available prn   4. Hyperlipidemia  - Continue Zocor    5. Current smoker  - Smoking 2 ppd  - Counseled toward cessation  - Nicotine patch provided  - RN asked to provide smoking cessation information prior to discharge   6. GERD - Stable - No EGD report on file  - Managed at home with daily PPI, will continue    Thank you for this consultation.  Our [facility]  hospitalist team will follow the patient with you.   Time Spent: 62 minutes.   Ilene Qua Martavius Lusty M.D. [facility] 03/12/2017, 8:20 PM

## 2017-03-12 NOTE — ED Notes (Signed)
Pt c/o abd pain, prn morphine given per order,

## 2017-03-12 NOTE — H&P (Signed)
Brandon Nelson is an 56 y.o. male.   Chief Complaint: abdominal pain HPI: 56 y/o presents to his PCP earlier today complaining of a 4-day history of abdominal pain.  Denies nausea, vomiting, diarrhea, SOB, CP or melena.  He was sent to radiology for a CT scan and was found to have acute appendicitis.  I have been asked to evaluate him for surgical intervention.    Past Medical History:  Diagnosis Date  . Anxiety   . Arthritis   . Chronic kidney disease    kidney stones  . COPD (chronic obstructive pulmonary disease) ([place])   . GERD (gastroesophageal reflux disease)   . Hyperlipidemia   . Hypertension   . Shortness of breath    exertion    Past Surgical History:  Procedure Laterality Date  . ANAL FISTULOTOMY    . COLONOSCOPY N/A 07/05/2015   Procedure: COLONOSCOPY;  Surgeon: Rogene Houston, MD;  Location: [facility];  Service: Endoscopy;  Laterality: N/A;  11:55  . CYSTOSCOPY    . CYSTOSCOPY W/ RETROGRADES Right 07/06/2013   Procedure: CYSTOSCOPY WITH RETROGRADE PYELOGRAM;  Surgeon: [name], MD;  Location: [facility];  Service: Urology;  Laterality: Right;  . left knee arthroscopy    . right knee arthroscopy    . right tendon foot    . STONE EXTRACTION WITH BASKET Right 07/06/2013   Procedure: STONE EXTRACTION WITH BASKET;  Surgeon: [name], MD;  Location: [facility];  Service: Urology;  Laterality: Right;  . URETEROSCOPY Right 07/06/2013   Procedure: URETEROSCOPY;  Surgeon: [name], MD;  Location: [facility];  Service: Urology;  Laterality: Right;    History reviewed. No pertinent family history. Social History:  reports that he has been smoking Cigarettes.  He has a 80.00 pack-year smoking history. He does not have any smokeless tobacco history on file. He reports that he drinks alcohol. He reports that he does not use drugs.  Allergies: No Known Allergies   (Not in a hospital admission)  Results for orders placed or performed during the hospital  encounter of 03/12/17 (from the past 48 hour(s))  CBC with Differential     Status: Abnormal   Collection Time: 03/12/17  6:58 PM  Result Value Ref Range   WBC 12.9 (H) 4.0 - 10.5 K/uL   RBC 4.66 4.22 - 5.81 MIL/uL   Hemoglobin 14.7 13.0 - 17.0 g/dL   HCT 43.6 39.0 - 52.0 %   MCV 93.6 78.0 - 100.0 fL   MCH 31.5 26.0 - 34.0 pg   MCHC 33.7 30.0 - 36.0 g/dL   RDW 12.7 11.5 - 15.5 %   Platelets 258 150 - 400 K/uL   Neutrophils Relative % 59 %   Neutro Abs 7.7 1.7 - 7.7 K/uL   Lymphocytes Relative 30 %   Lymphs Abs 3.8 0.7 - 4.0 K/uL   Monocytes Relative 7 %   Monocytes Absolute 0.9 0.1 - 1.0 K/uL   Eosinophils Relative 4 %   Eosinophils Absolute 0.5 0.0 - 0.7 K/uL   Basophils Relative 0 %   Basophils Absolute 0.0 0.0 - 0.1 K/uL  Basic metabolic panel     Status: Abnormal   Collection Time: 03/12/17  6:58 PM  Result Value Ref Range   Sodium 136 135 - 145 mmol/L   Potassium 3.8 3.5 - 5.1 mmol/L   Chloride 98 (L) 101 - 111 mmol/L   CO2 24 22 - 32 mmol/L   Glucose, Bld 103 (H)  65 - 99 mg/dL   BUN 12 6 - 20 mg/dL   Creatinine, Ser 0.85 0.61 - 1.24 mg/dL   Calcium 8.8 (L) 8.9 - 10.3 mg/dL   GFR calc non Af Amer >60 >60 mL/min   GFR calc Af Amer >60 >60 mL/min    Comment: (NOTE) The eGFR has been calculated using the CKD EPI equation. This calculation has not been validated in all clinical situations. eGFR's persistently <60 mL/min signify possible Chronic Kidney Disease.    Anion gap 14 5 - 15   Ct Abdomen Pelvis W Contrast  Result Date: 03/12/2017 CLINICAL DATA:  Right lower quadrant pain, nausea, and vomiting for 3 days. EXAM: CT ABDOMEN AND PELVIS WITH CONTRAST TECHNIQUE: Multidetector CT imaging of the abdomen and pelvis was performed using the standard protocol following bolus administration of intravenous contrast. CONTRAST:  133m ISOVUE-300 IOPAMIDOL (ISOVUE-300) INJECTION 61% COMPARISON:  10/25/2014 FINDINGS: Lower Chest: No acute findings. Hepatobiliary: No masses  identified. Moderate hepatic steatosis again noted. Gallbladder is unremarkable. Pancreas:  No mass or inflammatory changes. Spleen: Within normal limits in size and appearance. Adrenals/Urinary Tract: No masses identified. No evidence of hydronephrosis. Stomach/Bowel: Wall thickening of the distal appendix and mild periappendiceal inflammatory changes are consistent with acute appendicitis. No evidence of abscess or bowel obstruction. Sigmoid diverticulosis is demonstrated, without evidence of acute diverticulitis. Vascular/Lymphatic: No pathologically enlarged lymph nodes. No abdominal aortic aneurysm. Reproductive:  No mass or other significant abnormality. Other:  None. Musculoskeletal:  No suspicious bone lesions identified. IMPRESSION: Positive for acute appendicitis. No evidence of abscess or other complication. Colonic diverticulosis, without radiographic evidence of diverticulitis. Hepatic steatosis. Electronically Signed   By: JEarle GellM.D.   On: 03/12/2017 18:09    Review of Systems  Constitutional: Negative.   HENT: Negative.   Eyes: Negative.   Respiratory: Negative.   Cardiovascular: Negative.   Gastrointestinal: Positive for abdominal pain. Negative for diarrhea, nausea and vomiting.  Genitourinary: Negative.   Musculoskeletal: Negative.   Skin: Negative.   Neurological: Negative.   Endo/Heme/Allergies: Negative.   Psychiatric/Behavioral: Negative.     Blood pressure 134/77, pulse 86, temperature 98.4 F (36.9 C), temperature source Oral, resp. rate 18, height '5\' 7"'  (1.702 m), weight 273 lb (123.8 kg), SpO2 95 %. Physical Exam  Constitutional: He is oriented to person, place, and time. He appears well-developed and well-nourished.  HENT:  Head: Normocephalic and atraumatic.  Mouth/Throat: Oropharynx is clear and moist.  Eyes: Pupils are equal, round, and reactive to light. EOM are normal.  Neck: Normal range of motion. No JVD present. No tracheal deviation present. No  thyromegaly present.  Cardiovascular: Normal rate and regular rhythm.   Respiratory: Effort normal. No respiratory distress. He has wheezes. He has no rales. He exhibits no tenderness.  Musculoskeletal: Normal range of motion. He exhibits no edema or deformity.  Neurological: He is alert and oriented to person, place, and time. No cranial nerve deficit. Coordination normal.  Skin: Skin is warm and dry. No rash noted.  Psychiatric: He has a normal mood and affect. His behavior is normal. Judgment and thought content normal.     Assessment/Plan 56y/o male with acute appendicitis.  The CT scan does not demonstrate a perforation or abscess, however, his four day history of abdominal pain is concerning.  Plan for laparoscopic, possible open, appendectomy tonight.  R/B/A that are including but not limited to bleeding, perforation, extensive resection, and abscess was discussed with the patient and sister.  They agree with the  plan  Andres Labrum, MD 03/12/2017, 7:50 PM

## 2017-03-13 ENCOUNTER — Encounter (HOSPITAL_COMMUNITY): Payer: Self-pay | Admitting: *Deleted

## 2017-03-13 ENCOUNTER — Ambulatory Visit: Admit: 2017-03-13 | Payer: BLUE CROSS/BLUE SHIELD | Admitting: General Surgery

## 2017-03-13 ENCOUNTER — Observation Stay (HOSPITAL_COMMUNITY): Payer: BLUE CROSS/BLUE SHIELD | Admitting: Anesthesiology

## 2017-03-13 ENCOUNTER — Encounter (HOSPITAL_COMMUNITY)
Admission: EM | Disposition: A | Discharge status: Home / Self Care | Payer: Self-pay | Source: Home / Self Care | Attending: General Surgery

## 2017-03-13 DIAGNOSIS — K3580 Unspecified acute appendicitis: Secondary | ICD-10-CM | POA: Diagnosis not present

## 2017-03-13 HISTORY — PX: LAPAROSCOPIC APPENDECTOMY: SHX408

## 2017-03-13 LAB — BASIC METABOLIC PANEL
Anion gap: 7 (ref 5–15)
BUN: 12 mg/dL (ref 6–20)
CHLORIDE: 102 mmol/L (ref 101–111)
CO2: 28 mmol/L (ref 22–32)
CREATININE: 0.87 mg/dL (ref 0.61–1.24)
Calcium: 8.4 mg/dL — ABNORMAL LOW (ref 8.9–10.3)
GFR calc Af Amer: >60 mL/min (ref >60)
GFR calc non Af Amer: >60 mL/min (ref >60)
Glucose, Bld: 141 mg/dL — ABNORMAL HIGH (ref 65–99)
Potassium: 4.1 mmol/L (ref 3.5–5.1)
SODIUM: 137 mmol/L (ref 135–145)

## 2017-03-13 LAB — SURGICAL PCR SCREEN
MRSA, PCR: NEGATIVE
Staphylococcus aureus: NEGATIVE

## 2017-03-13 SURGERY — APPENDECTOMY, LAPAROSCOPIC
Anesthesia: General

## 2017-03-13 MED ORDER — MIDAZOLAM HCL 2 MG/2ML IJ SOLN
INTRAMUSCULAR | Status: AC
  Filled 2017-03-13: qty 2

## 2017-03-13 MED ORDER — GLYCOPYRROLATE 0.2 MG/ML IJ SOLN
INTRAMUSCULAR | Status: AC
  Filled 2017-03-13: qty 3

## 2017-03-13 MED ORDER — MIDAZOLAM HCL 5 MG/5ML IJ SOLN
INTRAMUSCULAR | Status: DC | PRN
  Administered 2017-03-13: 2 mg via INTRAVENOUS

## 2017-03-13 MED ORDER — ONDANSETRON HCL 4 MG PO TABS: 4.0000 mg | ORAL_TABLET | Freq: Four times a day (QID) | ORAL | 0 refills | Status: DC | PRN

## 2017-03-13 MED ORDER — ONDANSETRON HCL 4 MG/2ML IJ SOLN
INTRAMUSCULAR | Status: AC
  Filled 2017-03-13: qty 2

## 2017-03-13 MED ORDER — BUPIVACAINE-EPINEPHRINE (PF) 0.25% -1:200000 IJ SOLN
INTRAMUSCULAR | Status: DC | PRN
  Administered 2017-03-13: 60 mL via PERINEURAL

## 2017-03-13 MED ORDER — ROCURONIUM BROMIDE 100 MG/10ML IV SOLN
INTRAVENOUS | Status: DC | PRN
  Administered 2017-03-13: 10 mg via INTRAVENOUS
  Administered 2017-03-13: 20 mg via INTRAVENOUS

## 2017-03-13 MED ORDER — ONDANSETRON 4 MG PO TBDP
ORAL_TABLET | ORAL | Status: AC
  Filled 2017-03-13: qty 1

## 2017-03-13 MED ORDER — SUCCINYLCHOLINE CHLORIDE 20 MG/ML IJ SOLN
INTRAMUSCULAR | Status: DC | PRN
  Administered 2017-03-13: 140 mg via INTRAVENOUS

## 2017-03-13 MED ORDER — FENTANYL CITRATE (PF) 100 MCG/2ML IJ SOLN
INTRAMUSCULAR | Status: DC | PRN
  Administered 2017-03-13 (×3): 50 ug via INTRAVENOUS

## 2017-03-13 MED ORDER — LIDOCAINE HCL (CARDIAC) 20 MG/ML IV SOLN
INTRAVENOUS | Status: DC | PRN
  Administered 2017-03-13: 40 mg via INTRAVENOUS

## 2017-03-13 MED ORDER — FENTANYL CITRATE (PF) 100 MCG/2ML IJ SOLN: 25.0000 ug | INTRAMUSCULAR | Status: DC | PRN

## 2017-03-13 MED ORDER — IPRATROPIUM-ALBUTEROL 0.5-2.5 (3) MG/3ML IN SOLN
3.0000 mL | Freq: Once | RESPIRATORY_TRACT | Status: AC
  Administered 2017-03-13: 3 mL via RESPIRATORY_TRACT

## 2017-03-13 MED ORDER — PROPOFOL 10 MG/ML IV BOLUS
INTRAVENOUS | Status: AC
  Filled 2017-03-13: qty 40

## 2017-03-13 MED ORDER — BUPIVACAINE-EPINEPHRINE (PF) 0.25% -1:200000 IJ SOLN
INTRAMUSCULAR | Status: AC
  Filled 2017-03-13: qty 60

## 2017-03-13 MED ORDER — GLYCOPYRROLATE 0.2 MG/ML IJ SOLN
INTRAMUSCULAR | Status: DC | PRN
  Administered 2017-03-13: 0.6 mg via INTRAVENOUS
  Administered 2017-03-13: 0.2 mg via INTRAVENOUS

## 2017-03-13 MED ORDER — ONDANSETRON HCL 4 MG/2ML IJ SOLN
4.0000 mg | Freq: Once | INTRAMUSCULAR | Status: AC
  Administered 2017-03-13: 4 mg via INTRAVENOUS

## 2017-03-13 MED ORDER — MIDAZOLAM HCL 2 MG/2ML IJ SOLN
1.0000 mg | INTRAMUSCULAR | Status: AC
  Administered 2017-03-13: 2 mg via INTRAVENOUS

## 2017-03-13 MED ORDER — ARTIFICIAL TEARS OPHTHALMIC OINT
TOPICAL_OINTMENT | OPHTHALMIC | Status: AC
  Filled 2017-03-13: qty 3.5

## 2017-03-13 MED ORDER — LACTATED RINGERS IV SOLN
INTRAVENOUS | Status: DC
  Administered 2017-03-13 (×2): via INTRAVENOUS

## 2017-03-13 MED ORDER — SODIUM CHLORIDE 0.9 % IR SOLN
Status: DC | PRN
  Administered 2017-03-13: 1000 mL

## 2017-03-13 MED ORDER — NEOSTIGMINE METHYLSULFATE 10 MG/10ML IV SOLN
INTRAVENOUS | Status: DC | PRN
  Administered 2017-03-13: 4 mg via INTRAVENOUS

## 2017-03-13 MED ORDER — PROPOFOL 10 MG/ML IV BOLUS
INTRAVENOUS | Status: DC | PRN
  Administered 2017-03-13: 50 mg via INTRAVENOUS
  Administered 2017-03-13: 150 mg via INTRAVENOUS
  Administered 2017-03-13: 50 mg via INTRAVENOUS

## 2017-03-13 MED ORDER — EPHEDRINE SULFATE 50 MG/ML IJ SOLN
INTRAMUSCULAR | Status: DC | PRN
  Administered 2017-03-13 (×2): 10 mg via INTRAVENOUS

## 2017-03-13 MED ORDER — NEOSTIGMINE METHYLSULFATE 10 MG/10ML IV SOLN
INTRAVENOUS | Status: AC
  Filled 2017-03-13: qty 1

## 2017-03-13 MED ORDER — IPRATROPIUM-ALBUTEROL 0.5-2.5 (3) MG/3ML IN SOLN
RESPIRATORY_TRACT | Status: AC
  Filled 2017-03-13: qty 3

## 2017-03-13 MED ORDER — OXYCODONE HCL 5 MG PO TABS: 5.0000 mg | ORAL_TABLET | Freq: Four times a day (QID) | ORAL | 0 refills | Status: DC | PRN

## 2017-03-13 MED ORDER — ASPIRIN 81 MG PO TABS: 81.0000 mg | ORAL_TABLET | Freq: Every day | ORAL | 0 refills | Status: AC

## 2017-03-13 MED ORDER — PROPOFOL 10 MG/ML IV BOLUS
INTRAVENOUS | Status: AC
  Filled 2017-03-13: qty 20

## 2017-03-13 SURGICAL SUPPLY — 56 items
ADH SKN CLS APL DERMABOND .7 (GAUZE/BANDAGES/DRESSINGS) ×1
APL SKNCLS STERI-STRIP NONHPOA (GAUZE/BANDAGES/DRESSINGS) ×1
BAG HAMPER (MISCELLANEOUS) ×3 IMPLANT
BAG RETRIEVAL 10 (BASKET) ×1
BAG RETRIEVAL 10MM (BASKET) ×1
BENZOIN TINCTURE PRP APPL 2/3 (GAUZE/BANDAGES/DRESSINGS) ×3 IMPLANT
CLOSURE WOUND 1/2 X4 (GAUZE/BANDAGES/DRESSINGS) ×1
CLOTH BEACON ORANGE TIMEOUT ST (SAFETY) ×3 IMPLANT
COVER LIGHT HANDLE STERIS (MISCELLANEOUS) ×6 IMPLANT
CUTTER FLEX LINEAR 45M (STAPLE) ×3 IMPLANT
DECANTER SPIKE VIAL GLASS SM (MISCELLANEOUS) ×3 IMPLANT
DERMABOND ADVANCED (GAUZE/BANDAGES/DRESSINGS) ×2
DERMABOND ADVANCED .7 DNX12 (GAUZE/BANDAGES/DRESSINGS) IMPLANT
DEVICE SUTURE ENDOST 10MM (ENDOMECHANICALS) ×2 IMPLANT
DEVICE TROCAR PUNCTURE CLOSURE (ENDOMECHANICALS) ×3 IMPLANT
DURAPREP 26ML APPLICATOR (WOUND CARE) ×3 IMPLANT
ELECT REM PT RETURN 9FT ADLT (ELECTROSURGICAL) ×3
ELECTRODE REM PT RTRN 9FT ADLT (ELECTROSURGICAL) ×1 IMPLANT
FILTER SMOKE EVAC LAPAROSHD (FILTER) ×3 IMPLANT
FORMALIN 10 PREFIL 120ML (MISCELLANEOUS) ×3 IMPLANT
GLOVE BIOGEL PI IND STRL 6.5 (GLOVE) IMPLANT
GLOVE BIOGEL PI IND STRL 8.5 (GLOVE) ×1 IMPLANT
GLOVE BIOGEL PI INDICATOR 6.5 (GLOVE) ×2
GLOVE BIOGEL PI INDICATOR 8.5 (GLOVE) ×2
GLOVE ECLIPSE 8.5 STRL (GLOVE) ×3 IMPLANT
GLOVE SURG SS PI 6.5 STRL IVOR (GLOVE) ×2 IMPLANT
GOWN STRL REUS W/TWL LRG LVL3 (GOWN DISPOSABLE) ×3 IMPLANT
GOWN STRL REUS W/TWL XL LVL3 (GOWN DISPOSABLE) ×3 IMPLANT
INST SET LAPROSCOPIC AP (KITS) ×3 IMPLANT
KIT ROOM TURNOVER APOR (KITS) ×3 IMPLANT
MANIFOLD NEPTUNE II (INSTRUMENTS) ×3 IMPLANT
NDL INSUFFLATION 14GA 120MM (NEEDLE) ×1 IMPLANT
NEEDLE INSUFFLATION 14GA 120MM (NEEDLE) ×3 IMPLANT
NS IRRIG 1000ML POUR BTL (IV SOLUTION) ×3 IMPLANT
PACK LAP CHOLE LZT030E (CUSTOM PROCEDURE TRAY) ×3 IMPLANT
PAD ARMBOARD 7.5X6 YLW CONV (MISCELLANEOUS) ×3 IMPLANT
RELOAD 45 VASCULAR/THIN (ENDOMECHANICALS) IMPLANT
RELOAD STAPLE 45 2.5 WHT GRN (ENDOMECHANICALS) IMPLANT
RELOAD STAPLE 45 3.5 BLU ETS (ENDOMECHANICALS) IMPLANT
RELOAD STAPLE TA45 3.5 REG BLU (ENDOMECHANICALS) IMPLANT
SEALER TISSUE G2 CVD JAW 35 (ENDOMECHANICALS) ×1 IMPLANT
SEALER TISSUE G2 CVD JAW 45CM (ENDOMECHANICALS) ×2
SET BASIN LINEN APH (SET/KITS/TRAYS/PACK) ×3 IMPLANT
SET TUBE IRRIG SUCTION NO TIP (IRRIGATION / IRRIGATOR) IMPLANT
SLEEVE ENDOPATH XCEL 5M (ENDOMECHANICALS) IMPLANT
STRIP CLOSURE SKIN 1/2X4 (GAUZE/BANDAGES/DRESSINGS) ×2 IMPLANT
SUT MNCRL AB 4-0 PS2 18 (SUTURE) ×3 IMPLANT
SUT VIC AB 2-0 CT2 27 (SUTURE) ×3 IMPLANT
SYS BAG RETRIEVAL 10MM (BASKET) ×1
SYSTEM BAG RETRIEVAL 10MM (BASKET) ×1 IMPLANT
TRAY FOLEY CATH 16FR SILVER (SET/KITS/TRAYS/PACK) ×3 IMPLANT
TROCAR ENDO BLADELESS 11MM (ENDOMECHANICALS) ×3 IMPLANT
TROCAR ENDO BLADELESS 12MM (ENDOMECHANICALS) ×3 IMPLANT
TROCAR XCEL NON-BLD 5MMX100MML (ENDOMECHANICALS) ×3 IMPLANT
TUBING INSUFFLATION (TUBING) ×3 IMPLANT
WARMER LAPAROSCOPE (MISCELLANEOUS) ×3 IMPLANT

## 2017-03-13 NOTE — Progress Notes (Signed)
Patient returned to Rm 334 from PACU, patient able to respond to voice, lethargic, VS stable.

## 2017-03-13 NOTE — Anesthesia Postprocedure Evaluation (Signed)
Anesthesia Post Note  Patient: Brandon Nelson  Procedure(s) Performed: Procedure(s) (LRB): APPENDECTOMY LAPAROSCOPIC POSSIBLE OPEN EXCISION OF ABDOMINAL WALL MASS (N/A)  Patient location during evaluation: PACU Anesthesia Type: General Level of consciousness: awake and alert and oriented Pain management: pain level controlled Vital Signs Assessment: post-procedure vital signs reviewed and stable Respiratory status: spontaneous breathing Cardiovascular status: blood pressure returned to baseline and stable Postop Assessment: no signs of nausea or vomiting Anesthetic complications: no     Last Vitals:  Vitals:   03/13/17 1006 03/13/17 1020  BP: (!) 142/77 123/65  Pulse: 91 92  Resp: 20 (!) 22  Temp:  36.6 C    Last Pain:  Vitals:   03/13/17 1006  TempSrc:   PainSc: Asleep                 Coby Antrobus

## 2017-03-13 NOTE — Anesthesia Procedure Notes (Signed)
Procedure Name: Intubation Date/Time: 03/13/2017 7:54 AM Performed by: Andree Elk, AMY A Pre-anesthesia Checklist: Patient identified, Patient being monitored, Timeout performed, Emergency Drugs available and Suction available Patient Re-evaluated:Patient Re-evaluated prior to induction Oxygen Delivery Method: Circle System Utilized Preoxygenation: Pre-oxygenation with 100% oxygen Induction Type: IV induction, Rapid sequence and Cricoid Pressure applied Ventilation: Mask ventilation without difficulty Laryngoscope Size: Glidescope and 4 Grade View: Grade I Tube type: Oral Tube size: 7.0 mm Number of attempts: 1 Airway Equipment and Method: Stylet Placement Confirmation: ETT inserted through vocal cords under direct vision,  positive ETCO2 and breath sounds checked- equal and bilateral Secured at: 21 cm Tube secured with: Tape Dental Injury: Teeth and Oropharynx as per pre-operative assessment

## 2017-03-13 NOTE — Progress Notes (Signed)
Brandon Nelson is a 56 y.o. male patient. 1. Acute appendicitis, unspecified acute appendicitis type    Past Medical History:  Diagnosis Date  . Anxiety   . Arthritis   . Chronic kidney disease    kidney stones  . COPD (chronic obstructive pulmonary disease) ([place])   . GERD (gastroesophageal reflux disease)   . Hyperlipidemia   . Hypertension   . Shortness of breath    exertion   Current Facility-Administered Medications  Medication Dose Route Frequency Provider Last Rate Last Dose  . [MAR Hold] acetaminophen (TYLENOL) tablet 650 mg  650 mg Oral Q6H PRN Opyd, Ilene Qua, MD      . Doug Sou Hold] albuterol (PROVENTIL) (2.5 MG/3ML) 0.083% nebulizer solution 2.5 mg  2.5 mg Nebulization Q4H PRN Opyd, Ilene Qua, MD      . Doug Sou Hold] amLODipine (NORVASC) tablet 5 mg  5 mg Oral Daily Opyd, Timothy S, MD      . dextrose 5 %-0.45 % sodium chloride infusion   Intravenous Continuous Andres Labrum, MD 75 mL/hr at 03/13/17 0010    . enoxaparin (LOVENOX) injection 40 mg  40 mg Subcutaneous Q24H Andres Labrum, MD      . Doug Sou Hold] hydrALAZINE (APRESOLINE) injection 10 mg  10 mg Intravenous Q4H PRN Opyd, Ilene Qua, MD      . HYDROcodone-acetaminophen (NORCO/VICODIN) 5-325 MG per tablet 1-2 tablet  1-2 tablet Oral Q4H PRN Andres Labrum, MD      . Doug Sou Hold] mometasone-formoterol (DULERA) 200-5 MCG/ACT inhaler 2 puff  2 puff Inhalation BID Opyd, Ilene Qua, MD      . Doug Sou Hold] morphine 4 MG/ML injection 2-4 mg  2-4 mg Intravenous Q1H PRN Opyd, Ilene Qua, MD   4 mg at 03/12/17 2318  . [MAR Hold] nicotine (NICODERM CQ - dosed in mg/24 hours) patch 21 mg  21 mg Transdermal Daily Opyd, Ilene Qua, MD      . ondansetron (ZOFRAN-ODT) disintegrating tablet 4 mg  4 mg Oral Q6H PRN Andres Labrum, MD       Or  . ondansetron [facility]) injection 4 mg  4 mg Intravenous Q6H PRN Andres Labrum, MD      . Doug Sou Hold] ondansetron [facility]) tablet 4 mg  4 mg Oral Q6H PRN Opyd, Ilene Qua, MD       Or  . Doug Sou Hold]  ondansetron (ZOFRAN) injection 4 mg  4 mg Intravenous Q6H PRN Opyd, Ilene Qua, MD      . Doug Sou Hold] pantoprazole (PROTONIX) EC tablet 40 mg  40 mg Oral Daily Opyd, Ilene Qua, MD   40 mg at 03/13/17 0009  . piperacillin-tazobactam (ZOSYN) IVPB 3.375 g  3.375 g Intravenous Q8H Andres Labrum, MD 12.5 mL/hr at 03/13/17 0539 3.375 g at 03/13/17 0539  . [MAR Hold] simvastatin (ZOCOR) tablet 20 mg  20 mg Oral q1800 Opyd, Ilene Qua, MD       No Known Allergies Principal Problem:   Acute appendicitis Active Problems:   COPD (chronic obstructive pulmonary disease) ([facility])   Essential hypertension   High cholesterol   GERD (gastroesophageal reflux disease)   Tobacco abuse   Appendicitis   Appendicitis, acute  Blood pressure (!) 120/57, pulse 80, temperature 98.7 F (37.1 C), temperature source Oral, resp. rate 15, height 5\' 7"  (1.702 m), weight 272 lb 12.8 oz (123.7 kg), SpO2 93 %.  SubjectiveNo acute events overnight.   Objective  GEN: NAD ABD: soft, TTP in RLQ, non distended  Assessment & Plan OR today Andres Labrum 03/13/2017

## 2017-03-13 NOTE — Op Note (Signed)
Procedure(s): APPENDECTOMY LAPAROSCOPIC POSSIBLE OPEN EXCISION OF ABDOMINAL WALL MASS Procedure Note  Brandon Nelson male 56 y.o. 03/13/2017  Procedure(s) and Anesthesia Type:    * APPENDECTOMY LAPAROSCOPIC POSSIBLE OPEN EXCISION OF ABDOMINAL WALL MASS - General Transabdominal preperitoneal block Laparoscopic Excisional biopsy of abdominal wall mass  Surgeon(s) and Role:    Andres Labrum, MD - Primary   Indications: The patient was admitted to the hospital with a brief history of right-sided abdominal pain. A CT Scan revealed findings of acute appendicitis. The patient now presents for laparoscopic, possible open, appendectomy after discussing therapeutic alternatives.        Surgeon: Andres Labrum   Assistants: None  Anesthesia: General endotracheal anesthesia  ASA Class: none    Procedure Detail  APPENDECTOMY LAPAROSCOPIC POSSIBLE OPEN EXCISION OF ABDOMINAL WALL MASS  Findings: The patient was placed supine on the operating table.  After adequate anesthesia was induced, the abdomen was prepped and draped in sterile fashion and time-out performed.  A 2 cm infraumbilical incision was made and dissected along the umbilical stalk and gained access to the abdomen using a 5 mm port.  The surgical field was inspected and found to be free of injury.  Under direct visualization, a suprapubic 5 mm port was placed and the umbilical port was exchanged for a 12 mm port.  Then a 5 mm LLQ port was placed.  The cecum and appendix was identified in the RLQ and found to be inflamed but no clear sign of perforation.  However, there was a dark, lobulated mass noted on the anterior abdominal wall just medial to the site of the appendix.  Some minor scar tissue formed an attachment with a portion of small bowel.  This was easily incised and using electrocautery, the mass was dissected free from the anterior abdominal wall without violating the fascia.  The specimen was then passed off the field.   The attention was then turned back to the appendix and it was found to be densely adhered to the retroperitoneum but with blunt dissection is was freed.  With the appendix elevated to the anterior abdominal wall, a window was created at its base in the mesoappendix.  Then using a laparoscopic stapler with a blue load the appendix was transected and then the mesentery with a white load.  The specimen was retrieved using an endocatch bag and specimen passed off the field.  The RLQ was irrigated with normal saline and the irrigant evacuated.  No signs of bleeding was noted.  Then examining the anterior abdominal wall, a total of 60 cc of 0.25% marcaine with epinephrine was injected into the preperitoneal space, under direct visualization, circumferentially around each port site.  The umbilical port was removed and using both a 2-0 and 0 Vicryl suture and a transabdominal suture passer the fascial defect was closed.  The abdomen was desufflated and 4-0 monocryl was used to close the port sites in a subcuticular fashion.    Estimated Blood Loss:  Minimal         Drains: none         Total IV Fluids: see anesthesia note  Blood Given: none          Specimens: appendix         Implants: none        Complications:  * No complications entered in OR log *         Disposition: PACU - hemodynamically stable.  Condition: stable

## 2017-03-13 NOTE — Progress Notes (Signed)
Patient up and ambulated to bathroom. Patient unsteady.

## 2017-03-13 NOTE — Progress Notes (Signed)
Patient IV removed, tolerated well. Discharge instructions given to patient and family at bedside.  

## 2017-03-13 NOTE — Transfer of Care (Signed)
Immediate Anesthesia Transfer of Care Note  Patient: Brandon Nelson  Procedure(s) Performed: Procedure(s): APPENDECTOMY LAPAROSCOPIC POSSIBLE OPEN EXCISION OF ABDOMINAL WALL MASS (N/A)  Patient Location: PACU  Anesthesia Type:General  Level of Consciousness: awake, oriented and patient cooperative  Airway & Oxygen Therapy: Patient Spontanous Breathing and Patient connected to face mask oxygen  Post-op Assessment: Report given to RN and Post -op Vital signs reviewed and stable  Post vital signs: Reviewed and stable  Last Vitals:  Vitals:   03/13/17 0719 03/13/17 0735  BP: (!) 141/70   Pulse: 74   Resp: 16 (!) 38  Temp: 37.1 C     Last Pain:  Vitals:   03/13/17 0719  TempSrc: Oral  PainSc: 7       Patients Stated Pain Goal: 8 (84/03/75 4360)  Complications: No apparent anesthesia complications

## 2017-03-13 NOTE — Discharge Summary (Signed)
Physician Discharge Summary  Patient ID: Brandon Nelson MRN: [id number] DOB/AGE: December 27, 1960 56 y.o.  Admit date: 03/12/2017 Discharge date: 03/13/2017  Admission Diagnoses: Acute appendicitis  Discharge Diagnoses:  Principal Problem:   Acute appendicitis Active Problems:   COPD (chronic obstructive pulmonary disease) ([place])   Essential hypertension   High cholesterol   GERD (gastroesophageal reflux disease)   Tobacco abuse   Appendicitis   Appendicitis, acute   Discharged Condition: good  Hospital Course: 56 yo M was admitted on 03/12/2017 for acute appendicitis.  There were initial plans to undergo surgery on the evening of his arrival, however, given his COPD and current smoking history, the CRNA wished to have an Anesthesiologist available before moving ahead with surgery.  So Surgery was postponed until the am.  He underwent an uneventful laparoscopic appendectomy for non-perforated appendicitis and excision of an abdominal wall.  He did well postoperatively and was discharged home  Consults: Hospitalist  Significant Diagnostic Studies: radiology: CT scan: Acute non-perforated appendicitis  Treatments: surgery: Laparoscopic appendectomy  Discharge Exam: Blood pressure (!) 144/74, pulse 79, temperature 97.8 F (36.6 C), resp. rate (!) 23, height 5\' 7"  (1.702 m), weight 272 lb 12.8 oz (123.7 kg), SpO2 100 %. General appearance: alert and cooperative Chest wall: CTAB GI: Soft, appopriately TTP, Incisions c/d/i  Disposition: 01-Home or Self Care  Discharge Instructions    Diet - low sodium heart healthy    Complete by:  As directed    Discharge instructions    Complete by:  As directed    Activities as tolerated, keep incisions dry until f/u appt, may shower with back facing water but no bath tubs or swimming pools   Increase activity slowly    Complete by:  As directed    No dressing needed    Complete by:  As directed       Follow-up Information    Aviva Signs, MD Follow up in 1 week(s).   Specialty:  General Surgery Contact information: [id number] [address] [phone]           Signed: Andres Labrum 03/13/2017, 9:47 AM

## 2017-03-14 LAB — HIV ANTIBODY (ROUTINE TESTING W REFLEX): HIV SCREEN 4TH GENERATION: NONREACTIVE

## 2017-03-16 NOTE — Addendum Note (Signed)
Addendum  created 03/16/17 1016 by Mickel Baas, CRNA   Charge Capture section accepted

## 2017-03-17 ENCOUNTER — Encounter (HOSPITAL_COMMUNITY): Payer: Self-pay | Admitting: General Surgery

## 2017-03-19 ENCOUNTER — Encounter: Payer: Self-pay | Admitting: General Surgery

## 2017-03-19 ENCOUNTER — Ambulatory Visit (INDEPENDENT_AMBULATORY_CARE_PROVIDER_SITE_OTHER): Payer: Self-pay | Admitting: General Surgery

## 2017-03-19 VITALS — BP 162/76 | HR 81 | Temp 98.0°F | Resp 18 | Ht 67.0 in | Wt 274.0 lb

## 2017-03-19 DIAGNOSIS — Z09 Encounter for follow-up examination after completed treatment for conditions other than malignant neoplasm: Secondary | ICD-10-CM

## 2017-03-19 NOTE — Progress Notes (Signed)
Subjective:     Brandon Nelson  Status post laparoscopic appendectomy. Patient doing well. Objective:    BP (!) 162/76   Pulse 81   Temp 98 F (36.7 C)   Resp 18   Ht 5\' 7"  (1.702 m)   Wt 274 lb (124.3 kg)   BMI 42.91 kg/m   General:  alert, cooperative and no distress  Abdomen soft, incisions healing well. Minimal yellowish drainage from umbilical incision. Final pathology revealed acute appendicitis. No neoplasm seen.     Assessment:    Doing well postoperatively.    Plan:   May return to work without restrictions on 03/23/2017. Follow-up here as needed.

## 2017-04-16 ENCOUNTER — Encounter: Payer: Self-pay | Admitting: General Surgery

## 2017-04-16 ENCOUNTER — Ambulatory Visit (INDEPENDENT_AMBULATORY_CARE_PROVIDER_SITE_OTHER): Payer: BLUE CROSS/BLUE SHIELD | Admitting: General Surgery

## 2017-04-16 VITALS — BP 166/76 | HR 85 | Temp 97.7°F | Resp 18 | Ht 67.0 in | Wt 273.0 lb

## 2017-04-16 DIAGNOSIS — Z09 Encounter for follow-up examination after completed treatment for conditions other than malignant neoplasm: Secondary | ICD-10-CM

## 2017-04-16 NOTE — Progress Notes (Signed)
Subjective:     Brandon Nelson  Here as he was concerned about some redness around his umbilical incision site. He was started on Augmentin by his primary care physician last week. He states the drainage has decreased. He denies any fevers. Objective:    BP (!) 166/76   Pulse 85   Temp 97.7 F (36.5 C)   Resp 18   Ht 5\' 7"  (1.702 m)   Wt 273 lb (123.8 kg)   BMI 42.76 kg/m   General:  alert, cooperative and no distress  Abdomen soft. Infraumbilical incision is healed. Minimal erythema is noted. No active drainage noted. No abscess noted.     Assessment:    Cellulitis of the incision, resolved    Plan:   Finish antibiotic course. Follow-up. As needed.

## 2017-06-23 ENCOUNTER — Other Ambulatory Visit (HOSPITAL_COMMUNITY): Payer: Self-pay | Admitting: Family Medicine

## 2017-06-23 ENCOUNTER — Ambulatory Visit (HOSPITAL_COMMUNITY)
Admission: RE | Admit: 2017-06-23 | Discharge: 2017-06-23 | Disposition: A | Discharge status: Home / Self Care | Payer: BLUE CROSS/BLUE SHIELD | Source: Ambulatory Visit | Attending: Family Medicine | Admitting: Family Medicine

## 2017-06-23 DIAGNOSIS — J209 Acute bronchitis, unspecified: Secondary | ICD-10-CM | POA: Insufficient documentation

## 2017-11-02 ENCOUNTER — Other Ambulatory Visit (HOSPITAL_COMMUNITY): Payer: Self-pay | Admitting: Family Medicine

## 2017-11-02 ENCOUNTER — Ambulatory Visit (HOSPITAL_COMMUNITY)
Admission: RE | Admit: 2017-11-02 | Discharge: 2017-11-02 | Disposition: A | Discharge status: Home / Self Care | Payer: BLUE CROSS/BLUE SHIELD | Source: Ambulatory Visit | Attending: Family Medicine | Admitting: Family Medicine

## 2017-11-02 DIAGNOSIS — N200 Calculus of kidney: Secondary | ICD-10-CM | POA: Insufficient documentation

## 2017-11-02 DIAGNOSIS — K5730 Diverticulosis of large intestine without perforation or abscess without bleeding: Secondary | ICD-10-CM | POA: Insufficient documentation

## 2017-11-02 DIAGNOSIS — K760 Fatty (change of) liver, not elsewhere classified: Secondary | ICD-10-CM | POA: Diagnosis not present

## 2019-02-08 ENCOUNTER — Other Ambulatory Visit: Payer: Self-pay | Admitting: Orthopedic Surgery

## 2019-02-08 DIAGNOSIS — M5412 Radiculopathy, cervical region: Secondary | ICD-10-CM

## 2019-02-14 ENCOUNTER — Other Ambulatory Visit: Payer: Self-pay | Admitting: Orthopedic Surgery

## 2019-02-14 DIAGNOSIS — M542 Cervicalgia: Secondary | ICD-10-CM

## 2019-03-04 ENCOUNTER — Other Ambulatory Visit: Payer: BLUE CROSS/BLUE SHIELD

## 2019-03-12 ENCOUNTER — Other Ambulatory Visit: Payer: Self-pay

## 2019-03-12 ENCOUNTER — Ambulatory Visit
Admission: RE | Admit: 2019-03-12 | Discharge: 2019-03-12 | Disposition: A | Discharge status: Home / Self Care | Payer: BLUE CROSS/BLUE SHIELD | Source: Ambulatory Visit | Attending: Orthopedic Surgery | Admitting: Orthopedic Surgery

## 2019-03-12 DIAGNOSIS — M542 Cervicalgia: Secondary | ICD-10-CM

## 2019-03-28 ENCOUNTER — Other Ambulatory Visit: Payer: Self-pay

## 2019-03-28 ENCOUNTER — Ambulatory Visit
Admission: RE | Admit: 2019-03-28 | Discharge: 2019-03-28 | Disposition: A | Discharge status: Home / Self Care | Payer: BC Managed Care – PPO | Source: Ambulatory Visit | Attending: Orthopedic Surgery | Admitting: Orthopedic Surgery

## 2019-03-28 DIAGNOSIS — M5412 Radiculopathy, cervical region: Secondary | ICD-10-CM

## 2019-03-28 MED ORDER — TRIAMCINOLONE ACETONIDE 40 MG/ML IJ SUSP (RADIOLOGY)
60.0000 mg | Freq: Once | INTRAMUSCULAR | Status: AC
  Administered 2019-03-28: 09:00:00 60 mg via EPIDURAL

## 2019-03-28 MED ORDER — IOPAMIDOL (ISOVUE-M 300) INJECTION 61%
1.0000 mL | Freq: Once | INTRAMUSCULAR | Status: AC | PRN
  Administered 2019-03-28: 09:00:00 1 mL via EPIDURAL

## 2019-03-28 NOTE — Discharge Instructions (Signed)

## 2019-08-24 ENCOUNTER — Emergency Department (HOSPITAL_COMMUNITY): Payer: BC Managed Care – PPO

## 2019-08-24 ENCOUNTER — Observation Stay (HOSPITAL_BASED_OUTPATIENT_CLINIC_OR_DEPARTMENT_OTHER): Payer: BC Managed Care – PPO

## 2019-08-24 ENCOUNTER — Observation Stay (HOSPITAL_COMMUNITY)
Admission: EM | Admit: 2019-08-24 | Discharge: 2019-08-25 | Disposition: A | Discharge status: Home / Self Care | Payer: BC Managed Care – PPO | Attending: Family Medicine | Admitting: Family Medicine

## 2019-08-24 ENCOUNTER — Encounter (HOSPITAL_COMMUNITY): Payer: Self-pay | Admitting: Emergency Medicine

## 2019-08-24 ENCOUNTER — Other Ambulatory Visit: Payer: Self-pay

## 2019-08-24 DIAGNOSIS — N189 Chronic kidney disease, unspecified: Secondary | ICD-10-CM | POA: Insufficient documentation

## 2019-08-24 DIAGNOSIS — Z79899 Other long term (current) drug therapy: Secondary | ICD-10-CM | POA: Insufficient documentation

## 2019-08-24 DIAGNOSIS — I10 Essential (primary) hypertension: Secondary | ICD-10-CM

## 2019-08-24 DIAGNOSIS — I129 Hypertensive chronic kidney disease with stage 1 through stage 4 chronic kidney disease, or unspecified chronic kidney disease: Secondary | ICD-10-CM | POA: Diagnosis not present

## 2019-08-24 DIAGNOSIS — Z791 Long term (current) use of non-steroidal anti-inflammatories (NSAID): Secondary | ICD-10-CM | POA: Diagnosis not present

## 2019-08-24 DIAGNOSIS — R079 Chest pain, unspecified: Secondary | ICD-10-CM | POA: Diagnosis not present

## 2019-08-24 DIAGNOSIS — E785 Hyperlipidemia, unspecified: Secondary | ICD-10-CM | POA: Insufficient documentation

## 2019-08-24 DIAGNOSIS — Z720 Tobacco use: Secondary | ICD-10-CM

## 2019-08-24 DIAGNOSIS — K219 Gastro-esophageal reflux disease without esophagitis: Secondary | ICD-10-CM | POA: Diagnosis not present

## 2019-08-24 DIAGNOSIS — Z20828 Contact with and (suspected) exposure to other viral communicable diseases: Secondary | ICD-10-CM | POA: Insufficient documentation

## 2019-08-24 DIAGNOSIS — Z8249 Family history of ischemic heart disease and other diseases of the circulatory system: Secondary | ICD-10-CM | POA: Insufficient documentation

## 2019-08-24 DIAGNOSIS — F17210 Nicotine dependence, cigarettes, uncomplicated: Secondary | ICD-10-CM | POA: Diagnosis not present

## 2019-08-24 DIAGNOSIS — I25118 Atherosclerotic heart disease of native coronary artery with other forms of angina pectoris: Secondary | ICD-10-CM

## 2019-08-24 DIAGNOSIS — R0789 Other chest pain: Secondary | ICD-10-CM | POA: Diagnosis present

## 2019-08-24 DIAGNOSIS — J449 Chronic obstructive pulmonary disease, unspecified: Secondary | ICD-10-CM | POA: Insufficient documentation

## 2019-08-24 DIAGNOSIS — Z7982 Long term (current) use of aspirin: Secondary | ICD-10-CM | POA: Diagnosis not present

## 2019-08-24 LAB — HEMOGLOBIN A1C
Hgb A1c MFr Bld: 6.1 % — ABNORMAL HIGH (ref 4.8–5.6)
Hgb A1c MFr Bld: 6 % — ABNORMAL HIGH (ref 4.8–5.6)
Mean Plasma Glucose: 128.37 mg/dL
Mean Plasma Glucose: 125.5 mg/dL

## 2019-08-24 LAB — CBC
HCT: 48.4 % (ref 39.0–52.0)
Hemoglobin: 15.8 g/dL (ref 13.0–17.0)
MCH: 31.7 pg (ref 26.0–34.0)
MCHC: 32.6 g/dL (ref 30.0–36.0)
MCV: 97 fL (ref 80.0–100.0)
Platelets: 297 10*3/uL (ref 150–400)
RBC: 4.99 MIL/uL (ref 4.22–5.81)
RDW: 12.2 % (ref 11.5–15.5)
WBC: 8.4 10*3/uL (ref 4.0–10.5)
nRBC: 0 % (ref 0.0–0.2)

## 2019-08-24 LAB — LIPID PANEL
Cholesterol: 185 mg/dL (ref 0–200)
HDL: 43 mg/dL (ref >40)
LDL Cholesterol: 105 mg/dL — ABNORMAL HIGH (ref 0–99)
Total CHOL/HDL Ratio: 4.3 RATIO
Triglycerides: 187 mg/dL — ABNORMAL HIGH (ref <150)
VLDL: 37 mg/dL (ref 0–40)

## 2019-08-24 LAB — BASIC METABOLIC PANEL
Anion gap: 9 (ref 5–15)
BUN: 14 mg/dL (ref 6–20)
CO2: 29 mmol/L (ref 22–32)
Calcium: 9 mg/dL (ref 8.9–10.3)
Chloride: 99 mmol/L (ref 98–111)
Creatinine, Ser: 0.67 mg/dL (ref 0.61–1.24)
GFR calc Af Amer: >60 mL/min (ref >60)
GFR calc non Af Amer: >60 mL/min (ref >60)
Glucose, Bld: 126 mg/dL — ABNORMAL HIGH (ref 70–99)
Potassium: 3.9 mmol/L (ref 3.5–5.1)
Sodium: 137 mmol/L (ref 135–145)

## 2019-08-24 LAB — HIV ANTIBODY (ROUTINE TESTING W REFLEX): HIV Screen 4th Generation wRfx: NONREACTIVE

## 2019-08-24 LAB — TROPONIN I (HIGH SENSITIVITY)
Troponin I (High Sensitivity): 2 ng/L (ref <18)
Troponin I (High Sensitivity): 2 ng/L (ref <18)
Troponin I (High Sensitivity): 3 ng/L (ref <18)
Troponin I (High Sensitivity): <2 ng/L (ref <18)

## 2019-08-24 LAB — CBG MONITORING, ED
Glucose-Capillary: 103 mg/dL — ABNORMAL HIGH (ref 70–99)
Glucose-Capillary: 150 mg/dL — ABNORMAL HIGH (ref 70–99)
Glucose-Capillary: 124 mg/dL — ABNORMAL HIGH (ref 70–99)

## 2019-08-24 LAB — ECHOCARDIOGRAM COMPLETE
Height: 67 in
Weight: 4000 oz

## 2019-08-24 LAB — SARS CORONAVIRUS 2 (TAT 6-24 HRS): SARS Coronavirus 2: NEGATIVE

## 2019-08-24 MED ORDER — LISINOPRIL 10 MG PO TABS
20.0000 mg | ORAL_TABLET | Freq: Every day | ORAL | Status: DC
  Administered 2019-08-24 – 2019-08-25 (×2): 20 mg via ORAL
  Filled 2019-08-24 (×2): qty 2

## 2019-08-24 MED ORDER — INSULIN ASPART 100 UNIT/ML ~~LOC~~ SOLN
0.0000 [IU] | Freq: Three times a day (TID) | SUBCUTANEOUS | Status: DC
  Administered 2019-08-24: 1 [IU] via SUBCUTANEOUS
  Filled 2019-08-24: qty 1

## 2019-08-24 MED ORDER — NICOTINE 21 MG/24HR TD PT24: 21.0000 mg | MEDICATED_PATCH | TRANSDERMAL | Status: DC | PRN

## 2019-08-24 MED ORDER — SODIUM CHLORIDE 0.9% FLUSH
3.0000 mL | Freq: Two times a day (BID) | INTRAVENOUS | Status: DC
  Administered 2019-08-24: 3 mL via INTRAVENOUS

## 2019-08-24 MED ORDER — INSULIN ASPART 100 UNIT/ML ~~LOC~~ SOLN: 0.0000 [IU] | Freq: Every day | SUBCUTANEOUS | Status: DC

## 2019-08-24 MED ORDER — ACETAMINOPHEN 650 MG RE SUPP: 650.0000 mg | Freq: Four times a day (QID) | RECTAL | Status: DC | PRN

## 2019-08-24 MED ORDER — ASPIRIN 81 MG PO CHEW
324.0000 mg | CHEWABLE_TABLET | Freq: Once | ORAL | Status: AC
  Administered 2019-08-24: 324 mg via ORAL
  Filled 2019-08-24: qty 4

## 2019-08-24 MED ORDER — ONDANSETRON HCL 4 MG PO TABS: 4.0000 mg | ORAL_TABLET | Freq: Four times a day (QID) | ORAL | Status: DC | PRN

## 2019-08-24 MED ORDER — ACETAMINOPHEN 325 MG PO TABS: 650.0000 mg | ORAL_TABLET | Freq: Four times a day (QID) | ORAL | Status: DC | PRN

## 2019-08-24 MED ORDER — SIMVASTATIN 10 MG PO TABS
20.0000 mg | ORAL_TABLET | Freq: Every day | ORAL | Status: DC
  Administered 2019-08-25: 20 mg via ORAL
  Filled 2019-08-24: qty 2

## 2019-08-24 MED ORDER — PANTOPRAZOLE SODIUM 40 MG PO TBEC
40.0000 mg | DELAYED_RELEASE_TABLET | Freq: Every day | ORAL | Status: DC
  Administered 2019-08-24: 40 mg via ORAL
  Filled 2019-08-24 (×2): qty 1

## 2019-08-24 MED ORDER — AMLODIPINE BESYLATE 5 MG PO TABS
5.0000 mg | ORAL_TABLET | Freq: Every day | ORAL | Status: DC
  Administered 2019-08-25: 5 mg via ORAL
  Filled 2019-08-24: qty 1

## 2019-08-24 MED ORDER — NICOTINE 21 MG/24HR TD PT24: 21.0000 mg | MEDICATED_PATCH | Freq: Once | TRANSDERMAL | Status: DC

## 2019-08-24 MED ORDER — SODIUM CHLORIDE 0.9% FLUSH: 3.0000 mL | INTRAVENOUS | Status: DC | PRN

## 2019-08-24 MED ORDER — ENOXAPARIN SODIUM 40 MG/0.4ML ~~LOC~~ SOLN
40.0000 mg | SUBCUTANEOUS | Status: DC
  Administered 2019-08-24: 40 mg via SUBCUTANEOUS
  Filled 2019-08-24: qty 0.4

## 2019-08-24 MED ORDER — PERFLUTREN LIPID MICROSPHERE
1.0000 mL | INTRAVENOUS | Status: AC | PRN
  Administered 2019-08-24: 2 mL via INTRAVENOUS
  Administered 2019-08-24: 1 mL via INTRAVENOUS

## 2019-08-24 MED ORDER — ASPIRIN EC 81 MG PO TBEC
81.0000 mg | DELAYED_RELEASE_TABLET | Freq: Every day | ORAL | Status: DC
  Administered 2019-08-25: 81 mg via ORAL
  Filled 2019-08-24: qty 1

## 2019-08-24 MED ORDER — ONDANSETRON HCL 4 MG/2ML IJ SOLN: 4.0000 mg | Freq: Four times a day (QID) | INTRAMUSCULAR | Status: DC | PRN

## 2019-08-24 MED ORDER — SODIUM CHLORIDE 0.9 % IV SOLN: 250.0000 mL | INTRAVENOUS | Status: DC | PRN

## 2019-08-24 NOTE — Progress Notes (Signed)
*  PRELIMINARY RESULTS* Echocardiogram 2D Echocardiogram has been performed with Definity.  Samuel Germany 08/24/2019, 3:41 PM

## 2019-08-24 NOTE — ED Triage Notes (Signed)
Pt c/o of chest pressure x 3 weeks with dizziness, diarrhea, and lightheaded.

## 2019-08-24 NOTE — ED Notes (Signed)
ED Provider at bedside. 

## 2019-08-24 NOTE — H&P (Signed)
History and Physical    JANETTE DOUROS [id number] DOB: [birth year] DOA: 08/24/2019  PCP: Sharilyn Sites, MD   Patient coming from: Home  Chief Complaint: Worsening CP  HPI: Brandon Nelson is a 58 y.o. male with medical history significant for COPD with ongoing tobacco abuse, dyslipidemia, hypertension, and type 2 diabetes who presented to the ED with worsening chest pressure over the last 3 weeks.  He has had similar symptoms even prior to this, but has not had any significant duration or intensity.  He states that his pain had really worsened over the last 1 day and noticed that when he was trying to clean his house he had substernal chest pressure without any radiation.  He noted improvement with rest.  He had some lightheadedness a few times along with some diaphoresis and nausea but no emesis.  He has no lower extremity edema.  Of note, he is also noted for episodes of diarrhea.  No fevers or chills or abdominal pain noted.  He currently denies any active chest pain.   ED Course: Vital signs stable with no significant findings and no significant lab abnormalities noted.  Initial high-sensitivity troponin is noted to be 2 with repeat noted to be the same.  1 view chest x-ray with no acute findings and EKG at 78 bpm with sinus rhythm noted.  EDP had spoken with cardiologist Dr. Harrington Challenger who recommends admission for further evaluation and likely inpatient stress testing.  Review of Systems: All others reviewed as noted above and otherwise negative.  Past Medical History:  Diagnosis Date  . Anxiety   . Arthritis   . Chronic kidney disease    kidney stones  . COPD (chronic obstructive pulmonary disease) ([place])   . GERD (gastroesophageal reflux disease)   . Hyperlipidemia   . Hypertension   . Shortness of breath    exertion    Past Surgical History:  Procedure Laterality Date  . ANAL FISTULOTOMY    . COLONOSCOPY N/A 07/05/2015   Procedure: COLONOSCOPY;  Surgeon: Rogene Houston, MD;   [facility];  Service: Endoscopy;  Laterality: N/A;  11:55  . CYSTOSCOPY    . CYSTOSCOPY W/ RETROGRADES Right 07/06/2013   Procedure: CYSTOSCOPY WITH RETROGRADE PYELOGRAM;  Surgeon: [name], MD;  Location: [facility];  Service: Urology;  Laterality: Right;  . LAPAROSCOPIC APPENDECTOMY N/A 03/13/2017   Procedure: APPENDECTOMY LAPAROSCOPIC POSSIBLE OPEN EXCISION OF ABDOMINAL WALL MASS;  Surgeon: Andres Labrum, MD;  Location: [facility];  Service: General;  Laterality: N/A;  . left knee arthroscopy    . right knee arthroscopy    . right tendon foot    . STONE EXTRACTION WITH BASKET Right 07/06/2013   Procedure: STONE EXTRACTION WITH BASKET;  Surgeon: [name], MD;  Location: [facility];  Service: Urology;  Laterality: Right;  . URETEROSCOPY Right 07/06/2013   Procedure: URETEROSCOPY;  Surgeon: [name], MD;  Location: [facility];  Service: Urology;  Laterality: Right;     reports that he has been smoking cigarettes. He has a 80.00 pack-year smoking history. He has quit using smokeless tobacco. He reports current alcohol use. He reports that he does not use drugs.  No Known Allergies  No family history on file.  Prior to Admission medications   Medication Sig Start Date End Date Taking? Authorizing Provider  acetaminophen (TYLENOL) 500 MG tablet Take 500 mg by mouth every 6 (six) hours as needed for mild pain.    Yes [provider]  albuterol (PROVENTIL HFA;VENTOLIN HFA) 108 (90 BASE) MCG/ACT inhaler Inhale 2 puffs into the lungs.   Yes [provider]  amLODipine (NORVASC) 5 MG tablet Take 5 mg by mouth daily. 08/19/19  Yes [provider]  aspirin 81 MG tablet Take 1 tablet (81 mg total) by mouth daily. 03/20/17  Yes Andres Labrum, MD  lisinopril (PRINIVIL,ZESTRIL) 20 MG tablet Take 20 mg by mouth daily.   Yes [provider]  meloxicam (MOBIC) 7.5 MG tablet Take 15 mg by mouth at bedtime.    Yes [provider]  metFORMIN  (GLUCOPHAGE) 500 MG tablet Take 500 mg by mouth 2 (two) times daily. 06/20/19  Yes [provider]  pantoprazole (PROTONIX) 40 MG tablet Take 40 mg by mouth daily as needed.    Yes [provider]  simvastatin (ZOCOR) 20 MG tablet Take 20 mg by mouth daily. 07/08/19  Yes [provider]  ondansetron (ZOFRAN) 4 MG tablet Take 1 tablet (4 mg total) by mouth every 6 (six) hours as needed for nausea. Patient not taking: Reported on 08/24/2019 03/13/17   Andres Labrum, MD  oxyCODONE (OXY IR/ROXICODONE) 5 MG immediate release tablet Take 1-2 tablets (5-10 mg total) by mouth every 6 (six) hours as needed for severe pain. Patient not taking: Reported on 08/24/2019 03/13/17   Andres Labrum, MD    Physical Exam: Vitals:   08/24/19 0900 08/24/19 0930 08/24/19 1000 08/24/19 1130  BP: 129/67 131/70 108/63 136/68  Pulse: 78 79 74 77  Resp: 20 (!) 22 16 (!) 22  Temp:      TempSrc:      SpO2: 97% 97% 96% 96%  Weight:      Height:        Constitutional: NAD, calm, comfortable Vitals:   08/24/19 0900 08/24/19 0930 08/24/19 1000 08/24/19 1130  BP: 129/67 131/70 108/63 136/68  Pulse: 78 79 74 77  Resp: 20 (!) 22 16 (!) 22  Temp:      TempSrc:      SpO2: 97% 97% 96% 96%  Weight:      Height:       Eyes: lids and conjunctivae normal ENMT: Mucous membranes are moist.  Neck: normal, supple Respiratory: clear to auscultation bilaterally. Normal respiratory effort. No accessory muscle use.  Cardiovascular: Regular rate and rhythm, no murmurs. No extremity edema. Abdomen: no tenderness, no distention. Bowel sounds positive.  Musculoskeletal:  No joint deformity upper and lower extremities.   Skin: no rashes, lesions, ulcers.  Psychiatric: Normal judgment and insight. Alert and oriented x 3. Normal mood.   Labs on Admission: I have personally reviewed following labs and imaging studies  CBC: Recent Labs  Lab 08/24/19 0840  WBC 8.4  HGB 15.8  HCT 48.4  MCV 97.0  PLT  [id number]   Basic Metabolic Panel: Recent Labs  Lab 08/24/19 0840  NA 137  K 3.9  CL 99  CO2 29  GLUCOSE 126*  BUN 14  CREATININE 0.67  CALCIUM 9.0   GFR: Estimated Creatinine Clearance: 118.1 mL/min (by C-G formula based on SCr of 0.67 mg/dL). Liver Function Tests: No results for input(s): AST, ALT, ALKPHOS, BILITOT, PROT, ALBUMIN in the last 168 hours. No results for input(s): LIPASE, AMYLASE in the last 168 hours. No results for input(s): AMMONIA in the last 168 hours. Coagulation Profile: No results for input(s): INR, PROTIME in the last 168 hours. Cardiac Enzymes: No results for input(s): CKTOTAL, CKMB, CKMBINDEX, TROPONINI in  the last 168 hours. BNP (last 3 results) No results for input(s): PROBNP in the last 8760 hours. HbA1C: No results for input(s): HGBA1C in the last 72 hours. CBG: Recent Labs  Lab 08/24/19 0859  GLUCAP 103*   Lipid Profile: No results for input(s): CHOL, HDL, LDLCALC, TRIG, CHOLHDL, LDLDIRECT in the last 72 hours. Thyroid Function Tests: No results for input(s): TSH, T4TOTAL, FREET4, T3FREE, THYROIDAB in the last 72 hours. Anemia Panel: No results for input(s): VITAMINB12, FOLATE, FERRITIN, TIBC, IRON, RETICCTPCT in the last 72 hours. Urine analysis: No results found for: COLORURINE, [place]ARANCEUR, [place], [place], [place], [place], [place], [place], [place], [place], [place], LEUKOCYTESUR  Radiological Exams on Admission: DG Chest Portable 1 View  Result Date: 08/24/2019 CLINICAL DATA:  Chest pressure, dizziness EXAM: PORTABLE CHEST 1 VIEW COMPARISON:  06/23/2017 FINDINGS: Heart and mediastinal contours are within normal limits. No focal opacities or effusions. No acute bony abnormality. IMPRESSION: No active disease. Electronically Signed   By: Rolm Baptise M.D.   On: 08/24/2019 09:01    EKG: Independently reviewed. 78bpm SR.  Assessment/Plan Active Problems:   Chest pain    Chest pain with concern for unstable  angina -Appreciate cardiology evaluation -No elevation of troponin or EKG at this point and no further chest pain -We will order 2D echocardiogram and monitor on telemetry -Possible need for inpatient stress testing -Patient given full dose aspirin in ED  Dyslipidemia -Continue statin  Type 2 diabetes -Hold home Metformin and maintain on SSI  -Stable blood glucose control noted  Hypertension -Currently controlled -Continue amlodipine and lisinopril  Obesity -Lifestyle modification  COPD with ongoing tobacco use -Smokes 2 pack/day -We will use nicotine patch -No active signs of decompensation   DVT prophylaxis: Lovenox Code Status: Full Family Communication: None at bedside Disposition Plan:Chest pain evaluation per Cardiology Consults called:Cardiology Admission status: Observation, tele   Ramello Cordial Darleen Crocker DO [facility] Pager [phone]  If 7PM-7AM, please contact night-coverage [email or web address] Password [facility]  08/24/2019, 1:43 PM

## 2019-08-24 NOTE — ED Notes (Signed)
Pt refusing IV and POC Sars test at this time.  Evalee Jefferson, PA and Dr Sabra Heck aware.

## 2019-08-24 NOTE — Consult Note (Signed)
Cardiology Consultation:   Patient ID: Brandon Nelson MRN: [id number]; DOB: Nov 18, 1960  Admit date: 08/24/2019 Date of Consult: 08/24/2019  Primary Care Provider: Sharilyn Sites, MD Primary Cardiologist:   Patient Profile:   Brandon Nelson is a 58 y.o. male with a hx of  HTN who is being seen today for the evaluation of CP  at the request of Brandon Brandon Nelson  History of Present Illness:   Brandon Nelson is a 58 yo with hx of COPD, tobacco abuse, HL, HTN and DM   He is followed by Brandon Nelson   He notes intermittCP in the past with activity   Yesterday had 2 episodes, one during day and one in evening  Both associated with activty    Relieved with rest.   No SOB   Not pleuritic or positional    Pt has a long history of smoking   No recent cough, fever/chill Seen by Brandon Nelson today   Sent to ED Patient currently pain free .    Heart Pathway Score:  HEAR Score: 5  Past Medical History:  Diagnosis Date  . Anxiety   . Arthritis   . Chronic kidney disease    kidney stones  . COPD (chronic obstructive pulmonary disease) ([place])   . GERD (gastroesophageal reflux disease)   . Hyperlipidemia   . Hypertension   . Shortness of breath    exertion    Past Surgical History:  Procedure Laterality Date  . ANAL FISTULOTOMY    . COLONOSCOPY N/A 07/05/2015   Procedure: COLONOSCOPY;  Surgeon: Brandon Houston, MD;  [facility];  Service: Endoscopy;  Laterality: N/A;  11:55  . CYSTOSCOPY    . CYSTOSCOPY W/ RETROGRADES Right 07/06/2013   Procedure: CYSTOSCOPY WITH RETROGRADE PYELOGRAM;  Surgeon: Brandon Nestle, MD;  Location: [facility];  Service: Urology;  Laterality: Right;  . LAPAROSCOPIC APPENDECTOMY N/A 03/13/2017   Procedure: APPENDECTOMY LAPAROSCOPIC POSSIBLE OPEN EXCISION OF ABDOMINAL WALL MASS;  Surgeon: Brandon Labrum, MD;  Location: [facility];  Service: General;  Laterality: N/A;  . left knee arthroscopy    . right knee arthroscopy    . right tendon foot    . STONE EXTRACTION WITH  BASKET Right 07/06/2013   Procedure: STONE EXTRACTION WITH BASKET;  Surgeon: Brandon Nestle, MD;  Location: [facility];  Service: Urology;  Laterality: Right;  . URETEROSCOPY Right 07/06/2013   Procedure: URETEROSCOPY;  Surgeon: Brandon Nestle, MD;  Location: [facility];  Service: Urology;  Laterality: Right;       Inpatient Medications: Scheduled Meds: . amLODipine  5 mg Oral Daily  . aspirin EC  81 mg Oral Daily  . enoxaparin (LOVENOX) injection  40 mg Subcutaneous Q24H  . insulin aspart  0-5 Units Subcutaneous QHS  . insulin aspart  0-9 Units Subcutaneous TID WC  . lisinopril  20 mg Oral Daily  . pantoprazole  40 mg Oral Daily  . simvastatin  20 mg Oral Daily  . sodium chloride flush  3 mL Intravenous Q12H   Continuous Infusions: . sodium chloride     PRN Meds: sodium chloride, acetaminophen **OR** acetaminophen, nicotine, ondansetron **OR** ondansetron (ZOFRAN) IV, sodium chloride flush  Allergies:   No Known Allergies  Social History:   Social History   Socioeconomic History  . Marital status: Widowed    Spouse name: Not on file  . Number of children: Not on file  . Years of education: Not on file  . Highest education level:  Not on file  Occupational History  . Not on file  Tobacco Use  . Smoking status: Heavy Tobacco Smoker    Packs/day: 2.00    Years: 40.00    Pack years: 80.00    Types: Cigarettes  . Smokeless tobacco: Former Network engineer and Sexual Activity  . Alcohol use: Yes    Comment: beer occasional  . Drug use: No  . Sexual activity: Not on file  Other Topics Concern  . Not on file  Social History Narrative  . Not on file   Social Determinants of Health   Financial Resource Strain:   . Difficulty of Paying Living Expenses: Not on file  Food Insecurity:   . Worried About Charity fundraiser in the Last Year: Not on file  . Ran Out of Food in the Last Year: Not on file  Transportation Needs:   . Lack of Transportation (Medical): Not on file    . Lack of Transportation (Non-Medical): Not on file  Physical Activity:   . Days of Exercise per Week: Not on file  . Minutes of Exercise per Session: Not on file  Stress:   . Feeling of Stress : Not on file  Social Connections:   . Frequency of Communication with Friends and Family: Not on file  . Frequency of Social Gatherings with Friends and Family: Not on file  . Attends Religious Services: Not on file  . Active Member of Clubs or Organizations: Not on file  . Attends Archivist Meetings: Not on file  . Marital Status: Not on file  Intimate Partner Violence:   . Fear of Current or Ex-Partner: Not on file  . Emotionally Abused: Not on file  . Physically Abused: Not on file  . Sexually Abused: Not on file    Family History:   Father with CAD   ROS:  Please see the history of present illness.   All other ROS reviewed and negative.     Physical Exam/Data:   Vitals:   08/24/19 1245 08/24/19 1330 08/24/19 1345 08/24/19 1400  BP:  (!) 142/90  (!) 142/75  Pulse: 76  86 80  Resp: (!) 22 17 (!) 26 18  Temp:      TempSrc:      SpO2: 97%  95% 96%  Weight:      Height:       No intake or output data in the 24 hours ending 08/24/19 1523 Last 3 Weights 08/24/2019 04/16/2017 03/19/2017  Weight (lbs) 250 lb 273 lb 274 lb  Weight (kg) 113.399 kg 123.832 kg 124.286 kg     Body mass index is 39.16 kg/m.  General:  Morbidly obese 58 yo , in no acute distress  Exam deferred pending COVID test   No evid of labored breathing or edema Per hospitalist note no wheezing     EKG:  The EKG was personally reviewed and demonstrates:  SR  73 bpm   Telemetry:  Telemetry was personally reviewed and demonstrates:  SR   Relevant CV Studies: Echo today 1. Left ventricular ejection fraction, by visual estimation, is 65 to 70%. The left ventricle has normal function. There is no left ventricular hypertrophy.  2. Definity contrast agent was given IV to delineate the left ventricular  endocardial borders.  3. Small left ventricular internal cavity size.  4. The left ventricle has no regional wall motion abnormalities.  5. Global right ventricle has normal systolic function.The right ventricular size is normal. No  increase in right ventricular wall thickness.  6. Left atrial size was normal.  7. Right atrial size was normal.  8. The mitral valve is normal in structure. No evidence of mitral valve regurgitation.  9. The tricuspid valve is normal in structure. 10. The aortic valve is normal in structure. Aortic valve regurgitation is not visualized. 11. The pulmonic valve was not well visualized. Pulmonic valve regurgitation is not visualized.    Laboratory Data:  High Sensitivity Troponin:   Recent Labs  Lab 08/24/19 0840 08/24/19 1110  TROPONINIHS 2 2     Chemistry Recent Labs  Lab 08/24/19 0840  NA 137  K 3.9  CL 99  CO2 29  GLUCOSE 126*  BUN 14  CREATININE 0.67  CALCIUM 9.0  GFRNONAA >60  GFRAA >60  ANIONGAP 9    No results for input(s): PROT, ALBUMIN, AST, ALT, ALKPHOS, BILITOT in the last 168 hours. Hematology Recent Labs  Lab 08/24/19 0840  WBC 8.4  RBC 4.99  HGB 15.8  HCT 48.4  MCV 97.0  MCH 31.7  MCHC 32.6  RDW 12.2  PLT 297   BNPNo results for input(s): BNP, PROBNP in the last 168 hours.  DDimer No results for input(s): DDIMER in the last 168 hours.   Radiology/Studies:  DG Chest Portable 1 View  Result Date: 08/24/2019 CLINICAL DATA:  Chest pressure, dizziness EXAM: PORTABLE CHEST 1 VIEW COMPARISON:  06/23/2017 FINDINGS: Heart and mediastinal contours are within normal limits. No focal opacities or effusions. No acute bony abnormality. IMPRESSION: No active disease. Electronically Signed   By: Rolm Baptise M.D.   On: 08/24/2019 09:01   {    Assessment and Plan:   Pt is a 58 yo with no known CAD but long Hx of smoking  Presents with exertional angina, yesterday more pronounced  Pt with morbid obesity,HTN, HL, long hx of   tobacco use (since age 38)and FHx of CAD   EKG OK   Echo normal Worrisome that symptoms did not require signif exertion. Agree with admission as hx concerning for unstaable angina  Would check another troponin this evening   Awaiting COVID test   Will plan furhter testing tomorrow.  Lean toward CT angio or Cath given risk factors and history     2 HTN  Continue meds and follow   3   HL  Check lipids   Pt on statin  4  TOb use   Counselled on cessation.    For questions or updates, please contact [place] Please consult [email or web address] for contact info under     Signed, Dorris Carnes, MD  08/24/2019 3:23 PM

## 2019-08-24 NOTE — ED Provider Notes (Signed)
[facility] Provider Note   CSN: [id number] Arrival date & time: 08/24/19  [id number]     History No chief complaint on file.   Brandon Nelson is a 58 y.o. male with a history including COPD, GERD, hyperlipidemia, hypertension and tobacco abuse presenting for evaluation of chest pressure. He endorses having similar symptoms in the past but not as prevalent or long lasting.  He describes bilateral chest pressure radiating into his back which is intermittent but has been on and off yesterday, lasting for more than an hour at times, relieved by rest.  He was trying to clean his house yesterday while these were occurring.  He reports feeling lightheaded with a few episodes, endorses diaphoresis and nausea without emesis.  He denies any increased sob with these episodes, states "I'm always sob with my COPD". Pain is not pleuritic.  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 He was seen by his pcp this am and sent here for further evaluation.  Of note, he has also developed diarrhea, reporting 4 episodes since yesterday.  Denies abd pain or vomiting.  Has felt feverish, woke with sweats several times last night.  No known covid exposures. Has had no medicines prior to arrival.  HPI     Past Medical History:  Diagnosis Date  . Anxiety   . Arthritis   . Chronic kidney disease    kidney stones  . COPD (chronic obstructive pulmonary disease) ([place])   . GERD (gastroesophageal reflux disease)   . Hyperlipidemia   . Hypertension   . Shortness of breath    exertion    Patient Active Problem List   Diagnosis Date Noted  . Acute appendicitis 03/12/2017  . GERD (gastroesophageal reflux disease) 03/12/2017  . Tobacco abuse 03/12/2017  . Appendicitis 03/12/2017  . Appendicitis, acute 03/12/2017  . COPD (chronic obstructive pulmonary disease) ([place]) 05/25/2015  . Essential hypertension 05/25/2015  . High cholesterol 05/25/2015  . Arthritis 05/25/2015  . Leg pain  10/03/2013    Past Surgical History:  Procedure Laterality Date  . ANAL FISTULOTOMY    . COLONOSCOPY N/A 07/05/2015   Procedure: COLONOSCOPY;  Surgeon: Rogene Houston, MD;  Location: [facility];  Service: Endoscopy;  Laterality: N/A;  11:55  . CYSTOSCOPY    . CYSTOSCOPY W/ RETROGRADES Right 07/06/2013   Procedure: CYSTOSCOPY WITH RETROGRADE PYELOGRAM;  Surgeon: [name], MD;  Location: [facility];  Service: Urology;  Laterality: Right;  . LAPAROSCOPIC APPENDECTOMY N/A 03/13/2017   Procedure: APPENDECTOMY LAPAROSCOPIC POSSIBLE OPEN EXCISION OF ABDOMINAL WALL MASS;  Surgeon: Andres Labrum, MD;  Location: [facility];  Service: General;  Laterality: N/A;  . left knee arthroscopy    . right knee arthroscopy    . right tendon foot    . STONE EXTRACTION WITH BASKET Right 07/06/2013   Procedure: STONE EXTRACTION WITH BASKET;  Surgeon: [name], MD;  Location: [facility];  Service: Urology;  Laterality: Right;  . URETEROSCOPY Right 07/06/2013   Procedure: URETEROSCOPY;  Surgeon: [name], MD;  Location: [facility];  Service: Urology;  Laterality: Right;       No family history on file.  Social History   Tobacco Use  . Smoking status: Heavy Tobacco Smoker    Packs/day: 2.00    Years: 40.00    Pack years: 80.00    Types: Cigarettes  . Smokeless tobacco: Former Network engineer Use Topics  . Alcohol use: Yes    Comment: beer occasional  .  Drug use: No    Home Medications Prior to Admission medications   Medication Sig Start Date End Date Taking? Authorizing Provider  acetaminophen (TYLENOL) 500 MG tablet Take 500 mg by mouth every 6 (six) hours as needed for mild pain.     [provider]  albuterol (PROVENTIL HFA;VENTOLIN HFA) 108 (90 BASE) MCG/ACT inhaler Inhale 2 puffs into the lungs.    [provider]  aspirin 81 MG tablet Take 1 tablet (81 mg total) by mouth daily. 03/20/17   Andres Labrum, MD  lisinopril (PRINIVIL,ZESTRIL) 20 MG tablet Take 20 mg  by mouth daily.    [provider]  meloxicam (MOBIC) 7.5 MG tablet Take 15 mg by mouth at bedtime.     [provider]  ondansetron (ZOFRAN) 4 MG tablet Take 1 tablet (4 mg total) by mouth every 6 (six) hours as needed for nausea. 03/13/17   Andres Labrum, MD  oxyCODONE (OXY IR/ROXICODONE) 5 MG immediate release tablet Take 1-2 tablets (5-10 mg total) by mouth every 6 (six) hours as needed for severe pain. 03/13/17   Andres Labrum, MD  pantoprazole (PROTONIX) 40 MG tablet Take 40 mg by mouth daily as needed.     [provider]    Allergies    Patient has no known allergies.  Review of Systems   Review of Systems  Constitutional: Positive for diaphoresis. Negative for fever.  HENT: Negative for congestion and sore throat.   Eyes: Negative.   Respiratory: Positive for chest tightness. Negative for shortness of breath.   Cardiovascular: Negative for chest pain, palpitations and leg swelling.  Gastrointestinal: Positive for diarrhea and nausea. Negative for abdominal pain.  Genitourinary: Negative.   Musculoskeletal: Negative for arthralgias, joint swelling and neck pain.  Skin: Negative.  Negative for rash and wound.  Neurological: Negative for dizziness, weakness, light-headedness, numbness and headaches.  Psychiatric/Behavioral: Negative.     Physical Exam Updated Vital Signs There were no vitals taken for this visit.  Physical Exam Vitals and nursing note reviewed.  Constitutional:      Appearance: He is well-developed.  HENT:     Head: Normocephalic and atraumatic.  Eyes:     Conjunctiva/sclera: Conjunctivae normal.  Cardiovascular:     Rate and Rhythm: Normal rate and regular rhythm.     Heart sounds: Normal heart sounds.  Pulmonary:     Effort: Pulmonary effort is normal.     Breath sounds: Wheezing present. No rhonchi.     Comments: Bilateral expiratory wheeze (pt states chronic).  Abdominal:     General: Abdomen is protuberant. Bowel  sounds are normal.     Palpations: Abdomen is soft.     Tenderness: There is no abdominal tenderness. There is no guarding.  Musculoskeletal:        General: No tenderness. Normal range of motion.     Cervical back: Normal range of motion.     Right lower leg: No edema.     Left lower leg: No edema.  Skin:    General: Skin is warm and dry.  Neurological:     Mental Status: He is alert.     ED Results / Procedures / Treatments   Labs (all labs ordered are listed, but only abnormal results are displayed) Labs Reviewed  BASIC METABOLIC PANEL - Abnormal; Notable for the following components:      Result Value   Glucose, Bld 126 (*)    All other components within normal limits  CBG  MONITORING, ED - Abnormal; Notable for the following components:   Glucose-Capillary 103 (*)    All other components within normal limits  CBC  POC SARS CORONAVIRUS 2 AG -  ED  TROPONIN I (HIGH SENSITIVITY)    EKG EKG Interpretation  Date/Time:  Wednesday August 24 2019 08:17:53 EST Ventricular Rate:  78 PR Interval:    QRS Duration: 104 QT Interval:  390 QTC Calculation: 445 R Axis:   68 Text Interpretation: Sinus rhythm Anteroseptal infarct, age indeterminate since last tracing no significant change Confirmed by Noemi Chapel [phone]) on 08/24/2019 8:29:35 AM   Radiology DG Chest Portable 1 View  Result Date: 08/24/2019 CLINICAL DATA:  Chest pressure, dizziness EXAM: PORTABLE CHEST 1 VIEW COMPARISON:  06/23/2017 FINDINGS: Heart and mediastinal contours are within normal limits. No focal opacities or effusions. No acute bony abnormality. IMPRESSION: No active disease. Electronically Signed   By: Rolm Baptise M.D.   On: 08/24/2019 09:01    Procedures Procedures (including critical care time)  Medications Ordered in ED Medications - No data to display  ED Course  I have reviewed the triage vital signs and the nursing notes.  Pertinent labs & imaging results that were available during  my care of the patient were reviewed by me and considered in my medical decision making (see chart for details).    MDM Rules/Calculators/A&P HEAR Score: 5                    Pt with a heart score of 5 with sx suggesting unstable angina. ekg and troponins stable.   Resistant to staying for further testing, but after speaking with Dr. Harrington Challenger who agrees with need for admission and further testing, pt agrees to stay.  PCR covid collected.    Discussed with Dr. Manuella Ghazi of the hospitalist service who agrees to admit pt.   Final Clinical Impression(s) / ED Diagnoses Final diagnoses:  None    Rx / DC Orders ED Discharge Orders    None       Landis Martins 08/24/19 1158    Noemi Chapel, MD 08/25/19 [phone]

## 2019-08-24 NOTE — ED Notes (Signed)
Ambulated to BR without difficulty

## 2019-08-24 NOTE — ED Provider Notes (Signed)
58 y/o male - call taken from Dr. Hilma Favors - wants this patient evaluated for CAD - has multiple RF's, but on exam has wheezing - no chest tightness at this time, no edema of legs - will need cardiology consultation and recommendations.  EKG unremarkable.   EKG Interpretation  Date/Time:  Wednesday August 24 2019 08:17:53 EST Ventricular Rate:  78 PR Interval:    QRS Duration: 104 QT Interval:  390 QTC Calculation: 445 R Axis:   68 Text Interpretation: Sinus rhythm Anteroseptal infarct, age indeterminate since last tracing no significant change Confirmed by Noemi Chapel [phone]) on 08/24/2019 8:29:35 AM        Medical screening examination/treatment/procedure(s) were conducted as a shared visit with non-physician practitioner(s) and myself.  I personally evaluated the patient during the encounter.  Clinical Impression:   Final diagnoses:  Chest pain, unspecified type         Noemi Chapel, MD 08/25/19 [phone]

## 2019-08-25 ENCOUNTER — Encounter (HOSPITAL_COMMUNITY): Payer: Self-pay

## 2019-08-25 ENCOUNTER — Ambulatory Visit (HOSPITAL_COMMUNITY)
Admission: RE | Admit: 2019-08-25 | Discharge: 2019-08-25 | Disposition: A | Discharge status: Home / Self Care | Payer: BC Managed Care – PPO | Source: Ambulatory Visit | Attending: Internal Medicine | Admitting: Internal Medicine

## 2019-08-25 ENCOUNTER — Telehealth: Payer: Self-pay | Admitting: *Deleted

## 2019-08-25 ENCOUNTER — Encounter: Payer: Self-pay | Admitting: Student

## 2019-08-25 ENCOUNTER — Ambulatory Visit (HOSPITAL_COMMUNITY)
Admit: 2019-08-25 | Discharge: 2019-08-25 | Disposition: A | Discharge status: Home / Self Care | Payer: BC Managed Care – PPO | Attending: Internal Medicine | Admitting: Internal Medicine

## 2019-08-25 DIAGNOSIS — E785 Hyperlipidemia, unspecified: Secondary | ICD-10-CM | POA: Diagnosis not present

## 2019-08-25 DIAGNOSIS — I25119 Atherosclerotic heart disease of native coronary artery with unspecified angina pectoris: Secondary | ICD-10-CM

## 2019-08-25 DIAGNOSIS — R079 Chest pain, unspecified: Secondary | ICD-10-CM | POA: Diagnosis not present

## 2019-08-25 DIAGNOSIS — Z720 Tobacco use: Secondary | ICD-10-CM | POA: Diagnosis not present

## 2019-08-25 DIAGNOSIS — I10 Essential (primary) hypertension: Secondary | ICD-10-CM | POA: Diagnosis not present

## 2019-08-25 LAB — CBC
HCT: 48.6 % (ref 39.0–52.0)
Hemoglobin: 15.8 g/dL (ref 13.0–17.0)
MCH: 31.7 pg (ref 26.0–34.0)
MCHC: 32.5 g/dL (ref 30.0–36.0)
MCV: 97.6 fL (ref 80.0–100.0)
Platelets: 302 10*3/uL (ref 150–400)
RBC: 4.98 MIL/uL (ref 4.22–5.81)
RDW: 12.3 % (ref 11.5–15.5)
WBC: 9.3 10*3/uL (ref 4.0–10.5)
nRBC: 0 % (ref 0.0–0.2)

## 2019-08-25 LAB — BASIC METABOLIC PANEL
Anion gap: 12 (ref 5–15)
BUN: 14 mg/dL (ref 6–20)
CO2: 23 mmol/L (ref 22–32)
Calcium: 8.9 mg/dL (ref 8.9–10.3)
Chloride: 103 mmol/L (ref 98–111)
Creatinine, Ser: 0.76 mg/dL (ref 0.61–1.24)
GFR calc Af Amer: >60 mL/min (ref >60)
GFR calc non Af Amer: >60 mL/min (ref >60)
Glucose, Bld: 121 mg/dL — ABNORMAL HIGH (ref 70–99)
Potassium: 4.5 mmol/L (ref 3.5–5.1)
Sodium: 138 mmol/L (ref 135–145)

## 2019-08-25 LAB — CBG MONITORING, ED: Glucose-Capillary: 115 mg/dL — ABNORMAL HIGH (ref 70–99)

## 2019-08-25 MED ORDER — NITROGLYCERIN 0.4 MG SL SUBL
SUBLINGUAL_TABLET | SUBLINGUAL | Status: AC
  Administered 2019-08-25: 0.8 mg via SUBLINGUAL
  Filled 2019-08-25: qty 2

## 2019-08-25 MED ORDER — METOPROLOL TARTRATE 100 MG PO TABS: 100.0000 mg | ORAL_TABLET | Freq: Once | ORAL | 0 refills | Status: AC

## 2019-08-25 MED ORDER — NITROGLYCERIN 0.4 MG SL SUBL: 0.4000 mg | SUBLINGUAL_TABLET | SUBLINGUAL | 3 refills | Status: AC | PRN

## 2019-08-25 MED ORDER — IOHEXOL 350 MG/ML SOLN
80.0000 mL | Freq: Once | INTRAVENOUS | Status: AC | PRN
  Administered 2019-08-25: 80 mL via INTRAVENOUS

## 2019-08-25 MED ORDER — METOPROLOL TARTRATE 5 MG/5ML IV SOLN: 10.0000 mg | INTRAVENOUS | Status: DC | PRN

## 2019-08-25 MED ORDER — NITROGLYCERIN 0.4 MG SL SUBL: 0.8000 mg | SUBLINGUAL_TABLET | Freq: Once | SUBLINGUAL | Status: AC

## 2019-08-25 NOTE — Progress Notes (Addendum)
Progress Note  Patient Name: Brandon Nelson Date of Encounter: 08/25/2019  Primary Cardiologist: Evaluated by Dr. Harrington Challenger this admission  Subjective   He denies any recurrent pain since admission. No dyspnea or palpitations.   Inpatient Medications    Scheduled Meds: . amLODipine  5 mg Oral Daily  . aspirin EC  81 mg Oral Daily  . enoxaparin (LOVENOX) injection  40 mg Subcutaneous Q24H  . insulin aspart  0-5 Units Subcutaneous QHS  . insulin aspart  0-9 Units Subcutaneous TID WC  . lisinopril  20 mg Oral Daily  . pantoprazole  40 mg Oral Daily  . simvastatin  20 mg Oral Daily  . sodium chloride flush  3 mL Intravenous Q12H   Continuous Infusions: . sodium chloride     PRN Meds: sodium chloride, acetaminophen **OR** acetaminophen, nicotine, ondansetron **OR** ondansetron (ZOFRAN) IV, sodium chloride flush   Vital Signs    Vitals:   08/25/19 0500 08/25/19 0600 08/25/19 0700 08/25/19 0800  BP: 120/85 128/67 114/64 (!) 125/91  Pulse: 86 71 73 78  Resp: (!) 23 18 17 19   Temp:      TempSrc:      SpO2: 94% 91% 91% 93%  Weight:      Height:        Intake/Output Summary (Last 24 hours) at 08/25/2019 0849 Last data filed at 08/25/2019 0801 Gross per 24 hour  Intake --  Output 920 ml  Net -920 ml    Last 3 Weights 08/24/2019 04/16/2017 03/19/2017  Weight (lbs) 250 lb 273 lb 274 lb  Weight (kg) 113.399 kg 123.832 kg 124.286 kg      Telemetry    NSR, HR in 60's to 70's. Some strips labeled as NSVT but appears most consistent with artifact. - Personally Reviewed  ECG    NSR, HR 78 with no acute ST abnormalities.  - Personally Reviewed  Physical Exam   General: Well developed, well nourished, male appearing in no acute distress. Head: Normocephalic, atraumatic.  Neck: Supple without bruits, JVD not elevated. Lungs:  Resp regular and unlabored, CTA without wheezing or rales. Heart: RRR, S1, S2, no S3, S4, or murmur; no rub. Abdomen: Soft, non-tender,  non-distended with normoactive bowel sounds. No hepatomegaly. No rebound/guarding. No obvious abdominal masses. Extremities: No clubbing, cyanosis, or lower extremity edema. Distal pedal pulses are 2+ bilaterally. Neuro: Alert and oriented X 3. Moves all extremities spontaneously. Psych: Normal affect.  Labs    Chemistry Recent Labs  Lab 08/24/19 0840 08/25/19 0447  NA 137 138  K 3.9 4.5  CL 99 103  CO2 29 23  GLUCOSE 126* 121*  BUN 14 14  CREATININE 0.67 0.76  CALCIUM 9.0 8.9  GFRNONAA >60 >60  GFRAA >60 >60  ANIONGAP 9 12     Hematology Recent Labs  Lab 08/24/19 0840 08/25/19 0447  WBC 8.4 9.3  RBC 4.99 4.98  HGB 15.8 15.8  HCT 48.4 48.6  MCV 97.0 97.6  MCH 31.7 31.7  MCHC 32.6 32.5  RDW 12.2 12.3  PLT 297 302    Cardiac EnzymesNo results for input(s): TROPONINI in the last 168 hours. No results for input(s): TROPIPOC in the last 168 hours.   BNPNo results for input(s): BNP, PROBNP in the last 168 hours.   DDimer No results for input(s): DDIMER in the last 168 hours.   Radiology    DG Chest Portable 1 View  Result Date: 08/24/2019 CLINICAL DATA:  Chest pressure, dizziness EXAM: PORTABLE CHEST 1  VIEW COMPARISON:  06/23/2017 FINDINGS: Heart and mediastinal contours are within normal limits. No focal opacities or effusions. No acute bony abnormality. IMPRESSION: No active disease. Electronically Signed   By: Rolm Baptise M.D.   On: 08/24/2019 09:01   ECHOCARDIOGRAM COMPLETE  Result Date: 08/24/2019   ECHOCARDIOGRAM REPORT   Patient Name:   KARLAN MASTALSKI Date of Exam: 08/24/2019 Medical Rec #:  [id number]        Height:       67.0 in Accession #:    [id number]       Weight:       250.0 lb Date of Birth:  [birth year]        BSA:          2.22 m Patient Age:    8 years         BP:           136/68 mmHg Patient Gender: M                HR:           77 bpm. Exam Location:  [facility] Procedure: 2D Echo, Cardiac Doppler and Color Doppler Indications:    Chest Pain  786.50 / R07.9  History:        Patient has no prior history of Echocardiogram examinations.                 COPD; Risk Factors:Hypertension, Dyslipidemia and Current                 Smoker. Chronic kidney disease (From Hx).  Sonographer:    Alvino Chapel RCS Referring Phys: [id number] Royanne Foots [facility]  Sonographer Comments: Could Not obtain PLAX measurements. Technically Difficult Study. IMPRESSIONS  1. Left ventricular ejection fraction, by visual estimation, is 65 to 70%. The left ventricle has normal function. There is no left ventricular hypertrophy.  2. Definity contrast agent was given IV to delineate the left ventricular endocardial borders.  3. Small left ventricular internal cavity size.  4. The left ventricle has no regional wall motion abnormalities.  5. Global right ventricle has normal systolic function.The right ventricular size is normal. No increase in right ventricular wall thickness.  6. Left atrial size was normal.  7. Right atrial size was normal.  8. The mitral valve is normal in structure. No evidence of mitral valve regurgitation.  9. The tricuspid valve is normal in structure. 10. The aortic valve is normal in structure. Aortic valve regurgitation is not visualized. 11. The pulmonic valve was not well visualized. Pulmonic valve regurgitation is not visualized. FINDINGS  Left Ventricle: Left ventricular ejection fraction, by visual estimation, is 65 to 70%. The left ventricle has normal function. Definity contrast agent was given IV to delineate the left ventricular endocardial borders. The left ventricle has no regional wall motion abnormalities. The left ventricular internal cavity size was the LV cavity size is small. There is no left ventricular hypertrophy. Left ventricular diastolic parameters were normal. Right Ventricle: The right ventricular size is normal. No increase in right ventricular wall thickness. Global RV systolic function is has normal systolic function. Left Atrium: Left atrial  size was normal in size. Right Atrium: Right atrial size was normal in size Pericardium: There is no evidence of pericardial effusion. Mitral Valve: The mitral valve is normal in structure. No evidence of mitral valve regurgitation. Tricuspid Valve: The tricuspid valve is normal in structure. Tricuspid valve regurgitation is trivial. Aortic Valve: The aortic valve is  normal in structure. Aortic valve regurgitation is not visualized. Pulmonic Valve: The pulmonic valve was not well visualized. Pulmonic valve regurgitation is not visualized. Pulmonic regurgitation is not visualized. Aorta: The aortic root is normal in size and structure. IAS/Shunts: No atrial level shunt detected by color flow Doppler.  LEFT VENTRICLE PLAX 2D LVOT diam:     1.80 cm  Diastology LVOT Area:     2.54 cm LV e' lateral:   10.20 cm/s                         LV E/e' lateral: 7.2                         LV e' medial:    9.14 cm/s                         LV E/e' medial:  8.0  RIGHT VENTRICLE RV S prime:     14.90 cm/s TAPSE (M-mode): 2.8 cm LEFT ATRIUM           Index       RIGHT ATRIUM           Index LA Vol (A2C): 30.4 ml 13.67 ml/m RA Area:     17.40 cm LA Vol (A4C): 29.0 ml 13.04 ml/m RA Volume:   49.90 ml  22.44 ml/m  AORTIC VALVE LVOT Vmax:   111.00 cm/s LVOT Vmean:  75.200 cm/s LVOT VTI:    0.229 m MITRAL VALVE MV Area (PHT): 2.91 cm             SHUNTS MV PHT:        75.69 msec           Systemic VTI:  0.23 m MV Decel Time: 261 msec             Systemic Diam: 1.80 cm MV E velocity: 73.10 cm/s 103 cm/s MV A velocity: 65.50 cm/s 70.3 cm/s MV E/A ratio:  1.12       1.5  Dorris Carnes MD Electronically signed by Dorris Carnes MD Signature Date/Time: 08/24/2019/4:20:29 PM    Final     Cardiac Studies   Echocardiogram: 08/24/2019 IMPRESSIONS    1. Left ventricular ejection fraction, by visual estimation, is 65 to 70%. The left ventricle has normal function. There is no left ventricular hypertrophy.  2. Definity contrast agent was  given IV to delineate the left ventricular endocardial borders.  3. Small left ventricular internal cavity size.  4. The left ventricle has no regional wall motion abnormalities.  5. Global right ventricle has normal systolic function.The right ventricular size is normal. No increase in right ventricular wall thickness.  6. Left atrial size was normal.  7. Right atrial size was normal.  8. The mitral valve is normal in structure. No evidence of mitral valve regurgitation.  9. The tricuspid valve is normal in structure. 10. The aortic valve is normal in structure. Aortic valve regurgitation is not visualized. 11. The pulmonic valve was not well visualized. Pulmonic valve regurgitation is not visualized.  Patient Profile     58 y.o. male w/ PMH of HTN, HLD, Type 2 DM, COPD and tobacco use who is currently admitted for evaluation of chest pain.   Assessment & Plan    1. Chest Pain Concerning for Unstable Angina - he presented with intermittent episodes of chest pain that had been associated with activity.  Reported baseline dyspnea on exertion in the setting of COPD. Troponin values have been negative and EKG is without acute ST changes. Echocardiogram shows a preserved EF of 65-70% with no regional WMA. He does have multiple cardiac risk factors including HTN, HLD, Type 2 DM, family history of CAD and tobacco use.  - reviewed options with the patient and he wishes to avoid a cardiac catheterization. Will review with Dr. Harrington Challenger in regards to arranging Coronary CT as he is anxious to return to work and am unsure if Coronary CT could be performed today or would be on Monday given the Holiday weekend. - continue ASA and statin therapy. Would provide with Rx for SL NTG at the time of discharge. Instructions for use were reviewed with the patient.   2. HTN - BP has been at 106/50 - 155/91 since admission, at 125/91 this AM. He has been continued on PTA Amlodipine 5mg  daily and PTA Lisinopril 20mg  daily.     3. HLD - FLP this admission shows total cholesterol of 185, triglycerides 187, HDL 43 and LDL 105. On Simvastatin 20mg  daily as an outpatient. Will need to be transitioned to high-intensity statin therapy if found to have CAD.   4. Tobacco Use - he has been smoking since age 60. Cessation advised.    For questions or updates, please contact [place] Please consult [email or web address] for contact info under Cardiology/STEMI.   Signed, Erma Heritage , PA-C 8:49 AM 08/25/2019 Pager: [phone]  Pt seen and examined   I agree with findings as noted above  PT currently pain free ON exam:    Lungs   Mild wheeze on R  Otherwise clear Cardiac exam:  RRR  No S3   Abd supple  Ext without edema  He has ruled out for MI   Hx still very concerning   Pt has multiple risk factors. Pt has been set up for a coronary CT angiogram later today    At [place] I will follow up with results  Dorris Carnes MD

## 2019-08-25 NOTE — Discharge Summary (Signed)
Physician Discharge Summary  Brandon Nelson [id number] DOB: 12-31-1960 DOA: 08/24/2019  PCP: Sharilyn Sites, MD Cardiologist: Dorris Carnes MD   Admit date: 08/24/2019 Discharge date: 08/25/2019  Admitted From:  HOME  Disposition: HOME   Recommendations for Outpatient Follow-up:  1. Follow up with PCP in 1 week 2. Follow up with cardiology in 2 weeks 3. GO TO OUTPATIENT CORONARY CT AS SCHEDULED LATER TODAY  Discharge Condition: STABLE   CODE STATUS: FULL    Brief Hospitalization Summary: Please see all hospital notes, images, labs for full details of the hospitalization. ADMISSION HPI: Brandon Nelson is a 58 y.o. male with medical history significant for COPD with ongoing tobacco abuse, dyslipidemia, hypertension, and type 2 diabetes who presented to the ED with worsening chest pressure over the last 3 weeks.  He has had similar symptoms even prior to this, but has not had any significant duration or intensity.  He states that his pain had really worsened over the last 1 day and noticed that when he was trying to clean his house he had substernal chest pressure without any radiation.  He noted improvement with rest.  He had some lightheadedness a few times along with some diaphoresis and nausea but no emesis.  He has no lower extremity edema.  Of note, he is also noted for episodes of diarrhea.  No fevers or chills or abdominal pain noted.  He currently denies any active chest pain.   ED Course: Vital signs stable with no significant findings and no significant lab abnormalities noted.  Initial high-sensitivity troponin is noted to be 2 with repeat noted to be the same.  1 view chest x-ray with no acute findings and EKG at 78 bpm with sinus rhythm noted.  EDP had spoken with cardiologist Dr. Harrington Challenger who recommends admission for further evaluation and likely inpatient stress testing.  The patient was admitted for observation and serial HS troponin tests that have been negative.  Pt was seen  by cardiology service.  They had recommended cath but patient wanted to avoid cath and they have made arrangements for him to have outpatient coronary CT later today and could be discharged home.  He was given new Rx for sublingual nitroglycerin to use as needed.    Discharge Diagnoses:  Active Problems:   Chest pain   Discharge Instructions:  Allergies as of 08/25/2019   No Known Allergies     Medication List    STOP taking these medications   ondansetron 4 MG tablet Commonly known as: ZOFRAN   oxyCODONE 5 MG immediate release tablet Commonly known as: Oxy IR/ROXICODONE     TAKE these medications   acetaminophen 500 MG tablet Commonly known as: TYLENOL Take 500 mg by mouth every 6 (six) hours as needed for mild pain.   albuterol 108 (90 Base) MCG/ACT inhaler Commonly known as: VENTOLIN HFA Inhale 2 puffs into the lungs.   amLODipine 5 MG tablet Commonly known as: NORVASC Take 5 mg by mouth daily.   aspirin 81 MG tablet Take 1 tablet (81 mg total) by mouth daily.   lisinopril 20 MG tablet Commonly known as: ZESTRIL Take 20 mg by mouth daily.   meloxicam 7.5 MG tablet Commonly known as: MOBIC Take 15 mg by mouth at bedtime.   metFORMIN 500 MG tablet Commonly known as: GLUCOPHAGE Take 500 mg by mouth 2 (two) times daily.   nitroGLYCERIN 0.4 MG SL tablet Commonly known as: NITROSTAT Place 1 tablet (0.4 mg total) under the tongue  every 5 (five) minutes as needed for chest pain.   pantoprazole 40 MG tablet Commonly known as: PROTONIX Take 40 mg by mouth daily as needed.   simvastatin 20 MG tablet Commonly known as: ZOCOR Take 20 mg by mouth daily.      Follow-up Information    Sharilyn Sites, MD. Schedule an appointment as soon as possible for a visit in 1 week(s).   Specialty: Family Medicine Contact information: [address] [place] [id number] [phone]          No Known Allergies Allergies as of 08/25/2019   No Known  Allergies     Medication List    STOP taking these medications   ondansetron 4 MG tablet Commonly known as: ZOFRAN   oxyCODONE 5 MG immediate release tablet Commonly known as: Oxy IR/ROXICODONE     TAKE these medications   acetaminophen 500 MG tablet Commonly known as: TYLENOL Take 500 mg by mouth every 6 (six) hours as needed for mild pain.   albuterol 108 (90 Base) MCG/ACT inhaler Commonly known as: VENTOLIN HFA Inhale 2 puffs into the lungs.   amLODipine 5 MG tablet Commonly known as: NORVASC Take 5 mg by mouth daily.   aspirin 81 MG tablet Take 1 tablet (81 mg total) by mouth daily.   lisinopril 20 MG tablet Commonly known as: ZESTRIL Take 20 mg by mouth daily.   meloxicam 7.5 MG tablet Commonly known as: MOBIC Take 15 mg by mouth at bedtime.   metFORMIN 500 MG tablet Commonly known as: GLUCOPHAGE Take 500 mg by mouth 2 (two) times daily.   nitroGLYCERIN 0.4 MG SL tablet Commonly known as: NITROSTAT Place 1 tablet (0.4 mg total) under the tongue every 5 (five) minutes as needed for chest pain.   pantoprazole 40 MG tablet Commonly known as: PROTONIX Take 40 mg by mouth daily as needed.   simvastatin 20 MG tablet Commonly known as: ZOCOR Take 20 mg by mouth daily.       Procedures/Studies: DG Chest Portable 1 View  Result Date: 08/24/2019 CLINICAL DATA:  Chest pressure, dizziness EXAM: PORTABLE CHEST 1 VIEW COMPARISON:  06/23/2017 FINDINGS: Heart and mediastinal contours are within normal limits. No focal opacities or effusions. No acute bony abnormality. IMPRESSION: No active disease. Electronically Signed   By: Rolm Baptise M.D.   On: 08/24/2019 09:01   ECHOCARDIOGRAM COMPLETE  Result Date: 08/24/2019   ECHOCARDIOGRAM REPORT   Patient Name:   Brandon Nelson Date of Exam: 08/24/2019 Medical Rec #:  [id number]        Height:       67.0 in Accession #:    [id number]       Weight:       250.0 lb Date of Birth:  [birth year]        BSA:          2.22 m  Patient Age:    58 years         BP:           136/68 mmHg Patient Gender: M                HR:           77 bpm. Exam Location:  [facility] Procedure: 2D Echo, Cardiac Doppler and Color Doppler Indications:    Chest Pain 786.50 / R07.9  History:        Patient has no prior history of Echocardiogram examinations.  COPD; Risk Factors:Hypertension, Dyslipidemia and Current                 Smoker. Chronic kidney disease (From Hx).  Sonographer:    Alvino Chapel RCS Referring Phys: [id number] Royanne Foots [facility]  Sonographer Comments: Could Not obtain PLAX measurements. Technically Difficult Study. IMPRESSIONS  1. Left ventricular ejection fraction, by visual estimation, is 65 to 70%. The left ventricle has normal function. There is no left ventricular hypertrophy.  2. Definity contrast agent was given IV to delineate the left ventricular endocardial borders.  3. Small left ventricular internal cavity size.  4. The left ventricle has no regional wall motion abnormalities.  5. Global right ventricle has normal systolic function.The right ventricular size is normal. No increase in right ventricular wall thickness.  6. Left atrial size was normal.  7. Right atrial size was normal.  8. The mitral valve is normal in structure. No evidence of mitral valve regurgitation.  9. The tricuspid valve is normal in structure. 10. The aortic valve is normal in structure. Aortic valve regurgitation is not visualized. 11. The pulmonic valve was not well visualized. Pulmonic valve regurgitation is not visualized. FINDINGS  Left Ventricle: Left ventricular ejection fraction, by visual estimation, is 65 to 70%. The left ventricle has normal function. Definity contrast agent was given IV to delineate the left ventricular endocardial borders. The left ventricle has no regional wall motion abnormalities. The left ventricular internal cavity size was the LV cavity size is small. There is no left ventricular hypertrophy. Left ventricular  diastolic parameters were normal. Right Ventricle: The right ventricular size is normal. No increase in right ventricular wall thickness. Global RV systolic function is has normal systolic function. Left Atrium: Left atrial size was normal in size. Right Atrium: Right atrial size was normal in size Pericardium: There is no evidence of pericardial effusion. Mitral Valve: The mitral valve is normal in structure. No evidence of mitral valve regurgitation. Tricuspid Valve: The tricuspid valve is normal in structure. Tricuspid valve regurgitation is trivial. Aortic Valve: The aortic valve is normal in structure. Aortic valve regurgitation is not visualized. Pulmonic Valve: The pulmonic valve was not well visualized. Pulmonic valve regurgitation is not visualized. Pulmonic regurgitation is not visualized. Aorta: The aortic root is normal in size and structure. IAS/Shunts: No atrial level shunt detected by color flow Doppler.  LEFT VENTRICLE PLAX 2D LVOT diam:     1.80 cm  Diastology LVOT Area:     2.54 cm LV e' lateral:   10.20 cm/s                         LV E/e' lateral: 7.2                         LV e' medial:    9.14 cm/s                         LV E/e' medial:  8.0  RIGHT VENTRICLE RV S prime:     14.90 cm/s TAPSE (M-mode): 2.8 cm LEFT ATRIUM           Index       RIGHT ATRIUM           Index LA Vol (A2C): 30.4 ml 13.67 ml/m RA Area:     17.40 cm LA Vol (A4C): 29.0 ml 13.04 ml/m RA Volume:   49.90 ml  22.44  ml/m  AORTIC VALVE LVOT Vmax:   111.00 cm/s LVOT Vmean:  75.200 cm/s LVOT VTI:    0.229 m MITRAL VALVE MV Area (PHT): 2.91 cm             SHUNTS MV PHT:        75.69 msec           Systemic VTI:  0.23 m MV Decel Time: 261 msec             Systemic Diam: 1.80 cm MV E velocity: 73.10 cm/s 103 cm/s MV A velocity: 65.50 cm/s 70.3 cm/s MV E/A ratio:  1.12       1.5  Dorris Carnes MD Electronically signed by Dorris Carnes MD Signature Date/Time: 08/24/2019/4:20:29 PM    Final       Subjective: Pt says he feels  better and wants to go home, NO chest pain symptoms.   Discharge Exam: Vitals:   08/25/19 0700 08/25/19 0800  BP: 114/64 (!) 125/91  Pulse: 73 78  Resp: 17 19  Temp:    SpO2: 91% 93%   Vitals:   08/25/19 0500 08/25/19 0600 08/25/19 0700 08/25/19 0800  BP: 120/85 128/67 114/64 (!) 125/91  Pulse: 86 71 73 78  Resp: (!) 23 18 17 19   Temp:      TempSrc:      SpO2: 94% 91% 91% 93%  Weight:      Height:        General: Pt is alert, awake, not in acute distress Cardiovascular: RRR, S1/S2 +, no rubs, no gallops Respiratory: CTA bilaterally, no wheezing, no rhonchi Abdominal: Soft, NT, ND, bowel sounds + Extremities: no edema, no cyanosis   The results of significant diagnostics from this hospitalization (including imaging, microbiology, ancillary and laboratory) are listed below for reference.     Microbiology: Recent Results (from the past 240 hour(s))  SARS CORONAVIRUS 2 (TAT 6-24 HRS) Nasopharyngeal Nasopharyngeal Swab     Status: None   Collection Time: 08/24/19 11:55 AM   Specimen: Nasopharyngeal Swab  Result Value Ref Range Status   SARS Coronavirus 2 NEGATIVE NEGATIVE Final    Comment: (NOTE) SARS-CoV-2 target nucleic acids are NOT DETECTED. The SARS-CoV-2 RNA is generally detectable in upper and lower respiratory specimens during the acute phase of infection. Negative results do not preclude SARS-CoV-2 infection, do not rule out co-infections with other pathogens, and should not be used as the sole basis for treatment or other patient management decisions. Negative results must be combined with clinical observations, patient history, and epidemiological information. The expected result is Negative. Fact Sheet for Patients: SugarRoll.be Fact Sheet for Healthcare Providers: https://www.woods-mathews.com/ This test is not yet approved or cleared by the [place] FDA and  has been authorized for detection and/or diagnosis  of SARS-CoV-2 by FDA under an Emergency Use Authorization (EUA). This EUA will remain  in effect (meaning this test can be used) for the duration of the COVID-19 declaration under Section 56 4(b)(1) of the Act, 21 U.S.C. section 360bbb-3(b)(1), unless the authorization is terminated or revoked sooner. Performed at [facility], [place] [address] [place], [place] [address]      Labs: BNP (last 3 results) No results for input(s): BNP in the last 8760 hours. Basic Metabolic Panel: Recent Labs  Lab 08/24/19 0840 08/25/19 0447  NA 137 138  K 3.9 4.5  CL 99 103  CO2 29 23  GLUCOSE 126* 121*  BUN 14 14  CREATININE 0.67 0.76  CALCIUM  9.0 8.9   Liver Function Tests: No results for input(s): AST, ALT, ALKPHOS, BILITOT, PROT, ALBUMIN in the last 168 hours. No results for input(s): LIPASE, AMYLASE in the last 168 hours. No results for input(s): AMMONIA in the last 168 hours. CBC: Recent Labs  Lab 08/24/19 0840 08/25/19 0447  WBC 8.4 9.3  HGB 15.8 15.8  HCT 48.4 48.6  MCV 97.0 97.6  PLT 297 302   Cardiac Enzymes: No results for input(s): CKTOTAL, CKMB, CKMBINDEX, TROPONINI in the last 168 hours. BNP: Invalid input(s): POCBNP CBG: Recent Labs  Lab 08/24/19 0859 08/24/19 1707 08/24/19 2148 08/25/19 0825  GLUCAP 103* 150* 124* 115*   D-Dimer No results for input(s): DDIMER in the last 72 hours. Hgb A1c Recent Labs    08/24/19 1422 08/24/19 1725  HGBA1C 6.1* 6.0*   Lipid Profile Recent Labs    08/24/19 1725  CHOL 185  HDL 43  LDLCALC 105*  TRIG 187*  CHOLHDL 4.3   Thyroid function studies No results for input(s): TSH, T4TOTAL, T3FREE, THYROIDAB in the last 72 hours.  Invalid input(s): FREET3 Anemia work up No results for input(s): VITAMINB12, FOLATE, FERRITIN, TIBC, IRON, RETICCTPCT in the last 72 hours. Urinalysis No results found for: COLORURINE, APPEARANCEUR, [place], [place], [place], [place], [place], [place], PROTEINUR, UROBILINOGEN,  NITRITE, LEUKOCYTESUR Sepsis Labs Invalid input(s): PROCALCITONIN,  WBC,  LACTICIDVEN Microbiology Recent Results (from the past 240 hour(s))  SARS CORONAVIRUS 2 (TAT 6-24 HRS) Nasopharyngeal Nasopharyngeal Swab     Status: None   Collection Time: 08/24/19 11:55 AM   Specimen: Nasopharyngeal Swab  Result Value Ref Range Status   SARS Coronavirus 2 NEGATIVE NEGATIVE Final    Comment: (NOTE) SARS-CoV-2 target nucleic acids are NOT DETECTED. The SARS-CoV-2 RNA is generally detectable in upper and lower respiratory specimens during the acute phase of infection. Negative results do not preclude SARS-CoV-2 infection, do not rule out co-infections with other pathogens, and should not be used as the sole basis for treatment or other patient management decisions. Negative results must be combined with clinical observations, patient history, and epidemiological information. The expected result is Negative. Fact Sheet for Patients: SugarRoll.be Fact Sheet for Healthcare Providers: https://www.woods-mathews.com/ This test is not yet approved or cleared by the [place] FDA and  has been authorized for detection and/or diagnosis of SARS-CoV-2 by FDA under an Emergency Use Authorization (EUA). This EUA will remain  in effect (meaning this test can be used) for the duration of the COVID-19 declaration under Section 56 4(b)(1) of the Act, 21 U.S.C. section 360bbb-3(b)(1), unless the authorization is terminated or revoked sooner. Performed at [facility], [place] [address] [place], [place] [address]    Time coordinating discharge:   SIGNED:  Irwin Brakeman, MD  [facility] 08/25/2019, 9:56 AM How to contact the [facility] Attending or Consulting provider [place] or covering provider during after hours [place], for this patient?  1. Check the care team in [facility] and look for a) attending/consulting [facility] provider listed and b) the [facility] team  listed 2. Log into [email or web address] and use [place]'s universal password to access. If you do not have the password, please contact the hospital operator. 3. Locate the [facility] provider you are looking for under [facility] and page to a number that you can be directly reached. 4. If you still have difficulty reaching the provider, please page the [facility] (Director on Call) for the Hospitalists listed on amion for assistance.

## 2019-08-25 NOTE — Telephone Encounter (Signed)
Orders placed, and ED nurse [place] notified.

## 2019-08-25 NOTE — Discharge Instructions (Signed)
CORONARY CT SCHEDULED AT [place] (RADIOLOGY DEPARTMENT) at 1:30 PM THIS AFTERNOON.   Address for [facility] is: [address] [place], [place], [place] [address]   YOU WILL NEED TO TAKE A DOSE OF METOPROLOL UPON ARRIVING TO [place]. THIS WAS SENT TO THE [facility] ON [address] FOR CONVENIENCE SO YOU DO NOT HAVE TO DRIVE TO [place] TO PICK UP THE MEDICATION.        IMPORTANT INFORMATION: PAY CLOSE ATTENTION   PHYSICIAN DISCHARGE INSTRUCTIONS  Follow with Primary care provider  Sharilyn Sites, MD  and other consultants as instructed by your Hospitalist Physician  [place] IF SYMPTOMS COME BACK, WORSEN OR NEW PROBLEM DEVELOPS   Please note: You were cared for by a hospitalist during your hospital stay. Every effort will be made to forward records to your primary care provider.  You can request that your primary care provider send for your hospital records if they have not received them.  Once you are discharged, your primary care physician will handle any further medical issues. Please note that NO REFILLS for any discharge medications will be authorized once you are discharged, as it is imperative that you return to your primary care physician (or establish a relationship with a primary care physician if you do not have one) for your post hospital discharge needs so that they can reassess your need for medications and monitor your lab values.  Please get a complete blood count and chemistry panel checked by your Primary MD at your next visit, and again as instructed by your Primary MD.  Get Medicines reviewed and adjusted: Please take all your medications with you for your next visit with your Primary MD  Laboratory/radiological data: Please request your Primary MD to go over all hospital tests and procedure/radiological results at the follow up, please ask your primary care provider to get all Hospital records sent to his/her office.  In some  cases, they will be blood work, cultures and biopsy results pending at the time of your discharge. Please request that your primary care provider follow up on these results.  If you are diabetic, please bring your blood sugar readings with you to your follow up appointment with primary care.    Please call and make your follow up appointments as soon as possible.    Also Note the following: If you experience worsening of your admission symptoms, develop shortness of breath, life threatening emergency, suicidal or homicidal thoughts you must seek medical attention immediately by calling 911 or calling your MD immediately  if symptoms less severe.  You must read complete instructions/literature along with all the possible adverse reactions/side effects for all the Medicines you take and that have been prescribed to you. Take any new Medicines after you have completely understood and accpet all the possible adverse reactions/side effects.   Do not drive when taking Pain medications or sleeping medications (Benzodiazepines)  Do not take more than prescribed Pain, Sleep and Anxiety Medications. It is not advisable to combine anxiety,sleep and pain medications without talking with your primary care practitioner  Special Instructions: If you have smoked or chewed Tobacco  in the last 2 yrs please stop smoking, stop any regular Alcohol  and or any Recreational drug use.  Wear Seat belts while driving.  Do not drive if taking any narcotic, mind altering or controlled substances or recreational drugs or alcohol.

## 2019-08-25 NOTE — Discharge Instructions (Signed)
Cardiac CT Angiogram  A cardiac CT angiogram is a procedure to look at the heart and the area around the heart. It may be done to help find the cause of chest pains or other symptoms of heart disease. During this procedure, a large X-ray machine, called a CT scanner, takes detailed pictures of the heart and the surrounding area after a dye (contrast material) has been injected into blood vessels in the area. The procedure is also sometimes called a coronary CT angiogram, coronary artery scanning, or CTA. A cardiac CT angiogram allows the health care provider to see how well blood is flowing to and from the heart. The health care provider will be able to see if there are any problems, such as:  Blockage or narrowing of the coronary arteries in the heart.  Fluid around the heart.  Signs of weakness or disease in the muscles, valves, and tissues of the heart. Tell a health care provider about:  Any allergies you have. This is especially important if you have had a previous allergic reaction to contrast dye.  All medicines you are taking, including vitamins, herbs, eye drops, creams, and over-the-counter medicines.  Any blood disorders you have.  Any surgeries you have had.  Any medical conditions you have.  Whether you are pregnant or may be pregnant.  Any anxiety disorders, chronic pain, or other conditions you have that may increase your stress or prevent you from lying still. What are the risks? Generally, this is a safe procedure. However, problems may occur, including:  Bleeding.  Infection.  Allergic reactions to medicines or dyes.  Damage to other structures or organs.  Kidney damage from the dye or contrast that is used.  Increased risk of cancer from radiation exposure. This risk is low. Talk with your health care provider about: ? The risks and benefits of testing. ? How you can receive the lowest dose of radiation. What happens before the procedure?  Wear  comfortable clothing and remove any jewelry, glasses, dentures, and hearing aids.  Follow instructions from your health care provider about eating and drinking. This may include: ? For 12 hours before the test -- avoid caffeine. This includes tea, coffee, soda, energy drinks, and diet pills. Drink plenty of water or other fluids that do not have caffeine in them. Being well-hydrated can prevent complications. ? For 4-6 hours before the test -- stop eating and drinking. The contrast dye can cause nausea, but this is less likely if your stomach is empty.  Ask your health care provider about changing or stopping your regular medicines. This is especially important if you are taking diabetes medicines, blood thinners, or medicines to treat erectile dysfunction. What happens during the procedure?  Hair on your chest may need to be removed so that small sticky patches called electrodes can be placed on your chest. These will transmit information that helps to monitor your heart during the test.  An IV tube will be inserted into one of your veins.  You might be given a medicine to control your heart rate during the test. This will help to ensure that good images are obtained.  You will be asked to lie on an exam table. This table will slide in and out of the CT machine during the procedure.  Contrast dye will be injected into the IV tube. You might feel warm, or you may get a metallic taste in your mouth.  You will be given a medicine (nitroglycerin) to relax (dilate) the arteries   in your heart.  The table that you are lying on will move into the CT machine tunnel for the scan.  The person running the machine will give you instructions while the scans are being done. You may be asked to: ? Keep your arms above your head. ? Hold your breath. ? Stay very still, even if the table is moving.  When the scanning is complete, you will be moved out of the machine.  The IV tube will be removed. The  procedure may vary among health care providers and hospitals. What happens after the procedure?  You might feel warm, or you may get a metallic taste in your mouth from the contrast dye.  You may have a headache from the nitroglycerin.  After the procedure, drink water or other fluids to wash (flush) the contrast material out of your body.  Contact a health care provider if you have any symptoms of allergy to the contrast. These symptoms include: ? Shortness of breath. ? Rash or hives. ? A racing heartbeat.  Most people can return to their normal activities right after the procedure. Ask your health care provider what activities are safe for you.  It is up to you to get the results of your procedure. Ask your health care provider, or the department that is doing the procedure, when your results will be ready. Summary  A cardiac CT angiogram is a procedure to look at the heart and the area around the heart. It may be done to help find the cause of chest pains or other symptoms of heart disease.  During this procedure, a large X-ray machine, called a CT scanner, takes detailed pictures of the heart and the surrounding area after a dye (contrast material) has been injected into blood vessels in the area.  Ask your health care provider about changing or stopping your regular medicines before the procedure. This is especially important if you are taking diabetes medicines, blood thinners, or medicines to treat erectile dysfunction.  After the procedure, drink water or other fluids to wash (flush) the contrast material out of your body. This information is not intended to replace advice given to you by your health care provider. Make sure you discuss any questions you have with your health care provider. Document Released: 07/31/2008 Document Revised: 07/31/2017 Document Reviewed: 07/07/2016 Elsevier Patient Education  2020 Elsevier Inc. Testing With IV Contrast Material IV contrast material  is a fluid that is used with some imaging tests. It is injected into your body through a vein. Contrast material is used when your health care providers need a detailed look at organs, tissues, or blood vessels that may not show up with the standard test. The material may be used when an X-ray, an MRI, a CT scan, or an ultrasound is done. IV contrast material may be used for imaging tests that check:  Muscles, skin, and fat.  Breasts.  Brain.  Digestive tract.  Heart.  Organs such as the liver, kidneys, lungs, bladder, and many others.  Arteries and veins. Tell a health care provider about:  Any allergies you have, especially an allergy to contrast material.  All medicines you are taking, including metformin, beta blockers, NSAIDs (such as ibuprofen), interleukin-2, vitamins, herbs, eye drops, creams, and over-the-counter medicines.  Any problems you or family members have had with the use of contrast material.  Any blood disorders you have, such as sickle cell anemia.  Any surgeries you have had.  Any medical conditions you have or   have had, especially alcohol abuse, dehydration, asthma, or kidney, liver, or heart problems.  Whether you are pregnant or may be pregnant.  Whether you are breastfeeding. Most contrast materials are safe for use in breastfeeding women. What are the risks? Generally, this is a safe procedure. However, problems may occur, including:  Headache.  Itching, skin rash, and hives.  Nausea and vomiting.  Allergic reactions.  Wheezing or difficulty breathing.  Abnormal heart rate.  Changes in blood pressure.  Throat swelling.  Kidney damage. What happens before the procedure? Medicines Ask your health care provider about:  Changing or stopping your regular medicines. This is especially important if you are taking diabetes medicines or blood thinners.  Taking medicines such as aspirin and ibuprofen. These medicines can thin your blood. Do  not take these medicines unless your health care provider tells you to take them.  Taking over-the-counter medicines, vitamins, herbs, and supplements. If you are at risk of having a reaction to the IV contrast material, you may be asked to take medicine before the procedure to prevent a reaction. General instructions  Follow instructions from your health care provider about eating or drinking restrictions.  You may have an exam or lab tests to make sure that you can safely get IV contrast material.  Ask if you will be given a medicine to help you relax (sedative) during the procedure. If so, plan to have someone take you home from the hospital or clinic. What happens during the procedure?  You may be given a sedative to help you relax.  An IV will be inserted into one of your veins.  Contrast material will be injected into your IV.  You may feel warmth or flushing as the contrast material enters your bloodstream.  You may have a metallic taste in your mouth for a few minutes.  The needle may cause some discomfort and bruising.  After the contrast material is in your body, the imaging test will be done. The procedure may vary among health care providers and hospitals. What can I expect after the procedure?  The IV will be removed.  You may be taken to a recovery area if sedation medicines were used. Your blood pressure, heart rate, breathing rate, and blood oxygen level will be monitored until you leave the hospital or clinic. Follow these instructions at home:   Take over-the-counter and prescription medicines only as told by your health care provider. ? Your health care provider may tell you to not take certain medicines for a couple of days after the procedure. This is especially important if you are taking diabetes medicines.  If you are told, drink enough fluid to keep your urine pale yellow. This will help to remove the contrast material out of your body.  Do not drive  for 24 hours if you were given a sedative during your procedure.  It is up to you to get the results of your procedure. Ask your health care provider, or the department that is doing the procedure, when your results will be ready.  Keep all follow-up visits as told by your health care provider. This is important. Contact a health care provider if:  You have redness, swelling, or pain near your IV site. Get help right away if:  You have an abnormal heart rhythm.  You have trouble breathing.  You have: ? Chest pain. ? Pain in your back, neck, arm, jaw, or stomach. ? Nausea or sweating. ? Hives or a rash.  You start   shaking and cannot stop. These symptoms may represent a serious problem that is an emergency. Do not wait to see if the symptoms will go away. Get medical help right away. Call your local emergency services (911 in the [place] Do not drive yourself to the hospital. Summary  IV contrast material may be used for imaging tests to help your health care providers see your organs and tissues more clearly.  Tell your health care provider if you are pregnant or may be pregnant.  During the procedure, you may feel warmth or flushing as the contrast material enters your bloodstream.  After the procedure, drink enough fluid to keep your urine pale yellow. This information is not intended to replace advice given to you by your health care provider. Make sure you discuss any questions you have with your health care provider. Document Released: 08/06/2009 Document Revised: 11/04/2018 Document Reviewed: 11/04/2018 Elsevier Patient Education  2020 Elsevier Inc.  

## 2019-08-25 NOTE — Progress Notes (Signed)
Pt HR on arrival was 102 and then decreased to 88.  Pt stated he took metoprolol 100mg  PO about 15-20 min prior to arrival.  Had patient rest for about 30 min, HR decreased to mid 60's, OK for exam.  No extra metoprolol was given.  Pt tolerated exam without incident.  PIV removed, dressing applied.  Pt provided with snack and beverage post exam.  Discharge instructions discussed, all questions answered.  Pt discharged

## 2019-08-26 DIAGNOSIS — R079 Chest pain, unspecified: Secondary | ICD-10-CM | POA: Diagnosis not present

## 2019-08-29 ENCOUNTER — Telehealth: Payer: Self-pay | Admitting: Student

## 2019-08-29 DIAGNOSIS — Z79899 Other long term (current) drug therapy: Secondary | ICD-10-CM

## 2019-08-29 NOTE — Telephone Encounter (Signed)
Patient does not think he is to come back til May 2021. He stated that B.Strader told him   He also has questions about his medication

## 2019-08-30 MED ORDER — ISOSORBIDE MONONITRATE ER 30 MG PO TB24: 30.0000 mg | ORAL_TABLET | Freq: Every day | ORAL | 3 refills | Status: DC

## 2019-08-30 MED ORDER — ROSUVASTATIN CALCIUM 10 MG PO TABS: 10.0000 mg | ORAL_TABLET | Freq: Every day | ORAL | 3 refills | Status: AC

## 2019-08-30 NOTE — Telephone Encounter (Signed)
I will forward to B.Strader to clarify. States he is out of town till 09/11/2019   Was told to take Crestor but is on Zocor

## 2019-08-30 NOTE — Telephone Encounter (Signed)
    He was informed to follow-up in March per Dr. Harrington Challenger following his Coronary CT. In her result note, she stated to stop Zocor and start Crestor 10mg  daily and to have repeat FLP and LFT's in 2 months.   Signed, Erma Heritage, PA-C 08/30/2019, 9:49 AM Pager: [phone]

## 2019-08-30 NOTE — Telephone Encounter (Signed)
Message Received: 5 days ago Message Contents  Fay Records, MD  Pinnix, Marikay Alar, LPN  Spoke to pt re CT  1. Small nodule in R lung  REcomm f/u noncontrast CT lung in 1 year  2.  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  One area being analyzed with FFR  Small   Recomm trial meds:  Imdur 30 mg  daily  3. D/C simvistatin. Start Crestor 10 mg   LDL needs to be close to 70  F/U lipids in 2 months  4.  F/U appt end of march  5 NTG 0.4 called in as well as other meds have been called in to [facility]  6  OK to work         I called [facility] in [place], they say he had NTG already, he picked up Imdur, but insurance would not cover crestor. The pharmacist offered to call pt's insurance to see what they could do and call me back  I copied results of CT to pcp, Dr.Golden  F/U apt on 12/02/2019 at 3 pm  with B.Strader, PA-C (patient then cancelled, states he needs a Monday apt and he will call when he knows which Monday he is off)   Mailed lab slip to patient

## 2019-09-14 ENCOUNTER — Telehealth: Payer: Self-pay | Admitting: *Deleted

## 2019-09-14 NOTE — Telephone Encounter (Signed)
Error

## 2019-10-29 LAB — LIPID PANEL
Cholesterol: 147 mg/dL (ref <200)
HDL: 46 mg/dL (ref >=40)
LDL Cholesterol (Calc): 74 mg/dL (calc)
Non-HDL Cholesterol (Calc): 101 mg/dL (calc) (ref <130)
Total CHOL/HDL Ratio: 3.2 (calc) (ref <5.0)
Triglycerides: 171 mg/dL — ABNORMAL HIGH (ref <150)

## 2019-12-02 ENCOUNTER — Ambulatory Visit: Payer: BC Managed Care – PPO | Admitting: Student

## 2019-12-06 NOTE — Progress Notes (Signed)
Cardiology Office Note    Date:  12/12/2019   ID:  Brandon Nelson, DOB August 17, 1961, MRN [id number]  PCP:  Sharilyn Sites, MD  Cardiologist: No primary care provider on file. EPS: None  No chief complaint on file.   History of Present Illness:  Brandon Nelson is a 59 y.o. male with history of HTN, HLD, Type 2 DM, COPD and tobacco use.  Discharged 08/25/19 with chest pain-troponins negative, EKG without change, echo normal LVEF 65-70% no WMA. Coronary CTA with FFR-no flow limiting lesions, given a trial of Imdur.5 mm right lung nodule needs non contrast CT 12 months.   Patient comes in for f/u. No further chest pain. Smoking 2 ppd. A1C is better controlled. LDL 74 10/28/19. Long distant truck driver. Stopped Imdur because he didn't tolerate. Wants to have Dr. Hilma Favors manage cholesterol. Occasional leg swelling if on his feet a lot.     Past Medical History:  Diagnosis Date  . Anxiety   . Arthritis   . Chronic kidney disease    kidney stones  . COPD (chronic obstructive pulmonary disease) ([place])   . GERD (gastroesophageal reflux disease)   . Hyperlipidemia   . Hypertension   . Shortness of breath    exertion    Past Surgical History:  Procedure Laterality Date  . ANAL FISTULOTOMY    . COLONOSCOPY N/A 07/05/2015   Procedure: COLONOSCOPY;  Surgeon: Rogene Houston, MD;  Location: [facility];  Service: Endoscopy;  Laterality: N/A;  11:55  . CYSTOSCOPY    . CYSTOSCOPY W/ RETROGRADES Right 07/06/2013   Procedure: CYSTOSCOPY WITH RETROGRADE PYELOGRAM;  Surgeon: [name], MD;  Location: [facility];  Service: Urology;  Laterality: Right;  . LAPAROSCOPIC APPENDECTOMY N/A 03/13/2017   Procedure: APPENDECTOMY LAPAROSCOPIC POSSIBLE OPEN EXCISION OF ABDOMINAL WALL MASS;  Surgeon: Andres Labrum, MD;  Location: [facility];  Service: General;  Laterality: N/A;  . left knee arthroscopy    . right knee arthroscopy    . right tendon foot    . STONE EXTRACTION WITH BASKET Right  07/06/2013   Procedure: STONE EXTRACTION WITH BASKET;  Surgeon: [name], MD;  Location: [facility];  Service: Urology;  Laterality: Right;  . URETEROSCOPY Right 07/06/2013   Procedure: URETEROSCOPY;  Surgeon: [name], MD;  Location: [facility];  Service: Urology;  Laterality: Right;    Current Medications: Current Meds  Medication Sig  . acetaminophen (TYLENOL) 500 MG tablet Take 500 mg by mouth every 6 (six) hours as needed for mild pain.   Marland Kitchen albuterol (PROVENTIL HFA;VENTOLIN HFA) 108 (90 BASE) MCG/ACT inhaler Inhale 2 puffs into the lungs.  Marland Kitchen amLODipine (NORVASC) 5 MG tablet Take 5 mg by mouth daily.  Marland Kitchen aspirin 81 MG tablet Take 1 tablet (81 mg total) by mouth daily.  Marland Kitchen lisinopril (PRINIVIL,ZESTRIL) 20 MG tablet Take 20 mg by mouth daily.  . meloxicam (MOBIC) 7.5 MG tablet Take 15 mg by mouth at bedtime.   . metFORMIN (GLUCOPHAGE) 500 MG tablet Take 500 mg by mouth 2 (two) times daily.  . metoprolol tartrate (LOPRESSOR) 100 MG tablet Take 1 tablet (100 mg total) by mouth once for 1 dose. Take 2 hours Prior to CT Scan  . nitroGLYCERIN (NITROSTAT) 0.4 MG SL tablet Place 1 tablet (0.4 mg total) under the tongue every 5 (five) minutes as needed for chest pain.  . pantoprazole (PROTONIX) 40 MG tablet Take 40 mg by mouth daily as needed.   . rosuvastatin (  CRESTOR) 10 MG tablet Take 1 tablet (10 mg total) by mouth daily.  . [DISCONTINUED] isosorbide mononitrate (IMDUR) 30 MG 24 hr tablet Take 1 tablet (30 mg total) by mouth daily.     Allergies:   Patient has no known allergies.   Social History   Socioeconomic History  . Marital status: Widowed    Spouse name: Not on file  . Number of children: Not on file  . Years of education: Not on file  . Highest education level: Not on file  Occupational History  . Not on file  Tobacco Use  . Smoking status: Heavy Tobacco Smoker    Packs/day: 2.00    Years: 40.00    Pack years: 80.00    Types: Cigarettes  . Smokeless tobacco:  Former Network engineer and Sexual Activity  . Alcohol use: Yes    Comment: beer occasional  . Drug use: No  . Sexual activity: Not on file  Other Topics Concern  . Not on file  Social History Narrative  . Not on file   Social Determinants of Health   Financial Resource Strain:   . Difficulty of Paying Living Expenses:   Food Insecurity:   . Worried About Charity fundraiser in the Last Year:   . Arboriculturist in the Last Year:   Transportation Needs:   . Film/video editor (Medical):   Marland Kitchen Lack of Transportation (Non-Medical):   Physical Activity:   . Days of Exercise per Week:   . Minutes of Exercise per Session:   Stress:   . Feeling of Stress :   Social Connections:   . Frequency of Communication with Friends and Family:   . Frequency of Social Gatherings with Friends and Family:   . Attends Religious Services:   . Active Member of Clubs or Organizations:   . Attends Archivist Meetings:   Marland Kitchen Marital Status:      Family History:  The patient's family history is not on file.   ROS:   Please see the history of present illness.    ROS All other systems reviewed and are negative.   PHYSICAL EXAM:   VS:  BP 136/66 (BP Location: Left Arm)   Pulse 89   Temp 98.3 F (36.8 C)   Ht 5\' 8"  (1.727 m)   SpO2 95%   BMI 38.01 kg/m   Physical Exam  GEN: Obese, in no acute distress  Neck: no JVD, carotid bruits, or masses Cardiac:RRR; no murmurs, rubs, or gallops  Respiratory:  clear to auscultation bilaterally, normal work of breathing GI: soft, nontender, nondistended, + BS Ext: without cyanosis, clubbing, or edema, Good distal pulses bilaterally MS: no deformity or atrophy  Skin: warm and dry, no rash Neuro:  Alert and Oriented x 3, Strength and sensation are intact Psych: euthymic mood, full affect  Wt Readings from Last 3 Encounters:  08/24/19 250 lb (113.4 kg)  04/16/17 273 lb (123.8 kg)  03/19/17 274 lb (124.3 kg)      Studies/Labs Reviewed:     EKG:  EKG is not ordered today.    Recent Labs: 08/25/2019: BUN 14; Creatinine, Ser 0.76; Hemoglobin 15.8; Platelets 302; Potassium 4.5; Sodium 138   Lipid Panel    Component Value Date/Time   CHOL 147 10/28/2019 0834   TRIG 171 (H) 10/28/2019 0834   HDL 46 10/28/2019 0834   CHOLHDL 3.2 10/28/2019 0834   VLDL 37 08/24/2019 1725   LDLCALC 74 10/28/2019 [id number]  Additional studies/ records that were reviewed today include:   Coronary CTA/FFR FINDINGS: FFRct analysis was performed on the original cardiac CT angiogram dataset. Diagrammatic representation of the FFRct analysis is provided in a separate PDF document in PACS. This dictation was created using the PDF document and an interactive 3D model of the results. 3D model is not available in the EMR/PACS. Normal FFR range is >0.80.   1. Left Main: Normal FFRct extending to the distal segment with a value of 0.96.   2. LAD: Normal FFRct extending to the distal segment with a value of 0.95 prox, 0.94 mid, 0.89 distal.   3. LCX: Normal FFRct extending to the distal segment with a value of 0.99 prox, 0.96 mid (beyond anomalous track between posterior aorta and left atrium), 0.92 distal.   4. RCA: Normal FFRct extending to the distal segment with a value of 0.99 prox, 0.97 mid, 0.89 distal.   IMPRESSION: 1.Normal FFRct analysis with no flow limitation.   2. 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 No flow limitation by FFRct analysis.   Candee Furbish, MD [facility]   .IMPRESSION: 1. Coronary calcium score of 30. This was 1 percentile for age and sex matched control.   2. 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 In the area that the small caliber circumflex traverses posteriorly between aorta and left atrium, stenosis of this vessel can not be excluded. Will send for CTFFR analysis. This vessel  appears too small for percutaneous intervention. Otherwise, there is a right dominant system.   3. LAD is a large vessel that has a small area of mid vessel calcified plaque that is non flow limiting (0-24% stenosis - CAD RADS 1). The distal vessel tapers off to a small caliber vessel after the 3rd small diagonal branch.   4. RCA is a large dominant artery that gives rise to PDA and PLA. There is small area of proximal RCA calcified plaque (0-24% stenosis).   Candee Furbish, MD [facility]   Electronically Signed: By: Candee Furbish MD    FINDINGS: The visualized portions of the lower lung fields show 5 mm right lung nodule on image 2/series 12. Calcified granuloma noted medial right lower lobe on 31/12.   The visualized portions of the mediastinum and chest wall are unremarkable.   IMPRESSION: 5 mm right lung nodule. No follow-up needed if patient is low-risk. Non-contrast chest CT can be considered in 12 months if patient is high-risk. This recommendation follows the consensus statement: Guidelines for Management of Incidental Pulmonary Nodules Detected on CT Images: From the Fleischner Society 2017; Radiology 2017; 284:228-243.     Electronically Signed   By: Misty Stanley M.D.   On: 08/25/2019 16:40      ASSESSMENT:    1. Chest pain, unspecified type   2. Coronary artery disease involving native coronary artery of native heart without angina pectoris   3. Pulmonary nodule   4. Essential hypertension   5. Hyperlipidemia, unspecified hyperlipidemia type   6. Chronic obstructive pulmonary disease, unspecified COPD type ([place])   7. Tobacco abuse   8. Type 2 diabetes mellitus without complication, without long-term current use of insulin ([facility])      PLAN:  In order of problems listed above:  History of chest pain 08/2019 with Coronary CTA/FFR-no flow limiting lesions. Placed on Imdur but didn't tolerate. No further chest pain. Recommend 150 min exercise weekly, smoking  cessation. Wants to f/u with [place] prn.  5 mm pulmonary nodule on CT needs non contrast CT f/u 12 months-followed by PCP  HTN controlled  HLD LDL 74 10/2019  COPD with tobacco abuse-smoking cessation discussed-not interested in quitting at this time.  DM2-controlled  [id number] vaccination education-patient at high risk if he gets covid 19 infection. Strongly recommend vaccine.    Medication Adjustments/Labs and Tests Ordered: Current medicines are reviewed at length with the patient today.  Concerns regarding medicines are outlined above.  Medication changes, Labs and Tests ordered today are listed in the Patient Instructions below. There are no Patient Instructions on file for this visit.   Sumner Boast, PA-C  12/12/2019 11:02 AM    [facility] [address], [place], [place]  [address] Phone: [phone]; Fax: [phone]

## 2019-12-12 ENCOUNTER — Encounter: Payer: Self-pay | Admitting: Physician Assistant

## 2019-12-12 ENCOUNTER — Other Ambulatory Visit: Payer: Self-pay

## 2019-12-12 ENCOUNTER — Ambulatory Visit: Payer: BC Managed Care – PPO | Admitting: Physician Assistant

## 2019-12-12 VITALS — BP 136/66 | HR 89 | Temp 98.3°F | Ht 68.0 in

## 2019-12-12 DIAGNOSIS — Z720 Tobacco use: Secondary | ICD-10-CM

## 2019-12-12 DIAGNOSIS — R911 Solitary pulmonary nodule: Secondary | ICD-10-CM | POA: Diagnosis not present

## 2019-12-12 DIAGNOSIS — I10 Essential (primary) hypertension: Secondary | ICD-10-CM | POA: Diagnosis not present

## 2019-12-12 DIAGNOSIS — E785 Hyperlipidemia, unspecified: Secondary | ICD-10-CM

## 2019-12-12 DIAGNOSIS — E119 Type 2 diabetes mellitus without complications: Secondary | ICD-10-CM

## 2019-12-12 DIAGNOSIS — I2510 Atherosclerotic heart disease of native coronary artery without angina pectoris: Secondary | ICD-10-CM

## 2019-12-12 DIAGNOSIS — Z7189 Other specified counseling: Secondary | ICD-10-CM

## 2019-12-12 DIAGNOSIS — J449 Chronic obstructive pulmonary disease, unspecified: Secondary | ICD-10-CM

## 2019-12-12 DIAGNOSIS — R079 Chest pain, unspecified: Secondary | ICD-10-CM | POA: Diagnosis not present

## 2019-12-12 NOTE — Patient Instructions (Signed)
Medication Instructions:  Your physician recommends that you continue on your current medications as directed. Please refer to the Current Medication list given to you today.   Labwork: NONE  Testing/Procedures: NONE  Follow-Up: Your physician recommends that you schedule a follow-up appointment in: AS NEEDED    Any Other Special Instructions Will Be Listed Below (If Applicable).  PLEASE DO 150 MINUTES OF EXERCISE WEEKLY   PLEASE QUIT SMOKING    If you need a refill on your cardiac medications before your next appointment, please call your pharmacy.

## 2020-03-16 ENCOUNTER — Ambulatory Visit (HOSPITAL_COMMUNITY)
Admission: RE | Admit: 2020-03-16 | Discharge: 2020-03-16 | Disposition: A | Discharge status: Home / Self Care | Payer: BC Managed Care – PPO | Source: Ambulatory Visit | Attending: Internal Medicine | Admitting: Internal Medicine

## 2020-03-16 ENCOUNTER — Other Ambulatory Visit (HOSPITAL_COMMUNITY): Payer: Self-pay | Admitting: Internal Medicine

## 2020-03-16 ENCOUNTER — Other Ambulatory Visit: Payer: Self-pay

## 2020-03-16 DIAGNOSIS — R109 Unspecified abdominal pain: Secondary | ICD-10-CM | POA: Diagnosis present

## 2020-03-16 LAB — POCT I-STAT CREATININE: Creatinine, Ser: 0.7 mg/dL (ref 0.61–1.24)

## 2020-03-16 MED ORDER — BARIUM SULFATE 2.1 % PO SUSP
ORAL | Status: AC
  Filled 2020-03-16: qty 2

## 2020-03-16 MED ORDER — IOHEXOL 300 MG/ML  SOLN
100.0000 mL | Freq: Once | INTRAMUSCULAR | Status: AC | PRN
  Administered 2020-03-16: 100 mL via INTRAVENOUS

## 2020-03-16 MED ORDER — IOHEXOL 9 MG/ML PO SOLN
ORAL | Status: AC
  Filled 2020-03-16: qty 1000

## 2021-01-04 ENCOUNTER — Other Ambulatory Visit (HOSPITAL_COMMUNITY): Payer: Self-pay | Admitting: Internal Medicine

## 2021-01-04 ENCOUNTER — Other Ambulatory Visit: Payer: Self-pay | Admitting: Internal Medicine

## 2021-01-04 ENCOUNTER — Ambulatory Visit (HOSPITAL_COMMUNITY)
Admission: RE | Admit: 2021-01-04 | Discharge: 2021-01-04 | Disposition: A | Discharge status: Home / Self Care | Payer: BC Managed Care – PPO | Source: Ambulatory Visit | Attending: Internal Medicine | Admitting: Internal Medicine

## 2021-01-04 DIAGNOSIS — R311 Benign essential microscopic hematuria: Secondary | ICD-10-CM | POA: Diagnosis not present

## 2021-01-04 DIAGNOSIS — R1011 Right upper quadrant pain: Secondary | ICD-10-CM | POA: Diagnosis not present

## 2021-01-04 DIAGNOSIS — Z6837 Body mass index (BMI) 37.0-37.9, adult: Secondary | ICD-10-CM | POA: Diagnosis not present

## 2021-01-04 DIAGNOSIS — Z1331 Encounter for screening for depression: Secondary | ICD-10-CM | POA: Diagnosis not present

## 2021-01-04 DIAGNOSIS — I10 Essential (primary) hypertension: Secondary | ICD-10-CM | POA: Diagnosis not present

## 2021-01-04 DIAGNOSIS — M5414 Radiculopathy, thoracic region: Secondary | ICD-10-CM | POA: Diagnosis not present

## 2021-01-04 DIAGNOSIS — K7689 Other specified diseases of liver: Secondary | ICD-10-CM | POA: Diagnosis not present

## 2021-01-07 ENCOUNTER — Encounter (INDEPENDENT_AMBULATORY_CARE_PROVIDER_SITE_OTHER): Payer: Self-pay | Admitting: *Deleted

## 2021-01-08 ENCOUNTER — Encounter (INDEPENDENT_AMBULATORY_CARE_PROVIDER_SITE_OTHER): Payer: Self-pay | Admitting: *Deleted

## 2021-01-11 DIAGNOSIS — Z6837 Body mass index (BMI) 37.0-37.9, adult: Secondary | ICD-10-CM | POA: Diagnosis not present

## 2021-01-11 DIAGNOSIS — K760 Fatty (change of) liver, not elsewhere classified: Secondary | ICD-10-CM | POA: Diagnosis not present

## 2021-01-11 DIAGNOSIS — N200 Calculus of kidney: Secondary | ICD-10-CM | POA: Diagnosis not present

## 2021-01-16 ENCOUNTER — Ambulatory Visit (INDEPENDENT_AMBULATORY_CARE_PROVIDER_SITE_OTHER): Payer: BC Managed Care – PPO | Admitting: Urology

## 2021-01-16 ENCOUNTER — Encounter: Payer: Self-pay | Admitting: Urology

## 2021-01-16 ENCOUNTER — Ambulatory Visit (HOSPITAL_COMMUNITY)
Admission: RE | Admit: 2021-01-16 | Discharge: 2021-01-16 | Disposition: A | Discharge status: Home / Self Care | Payer: BC Managed Care – PPO | Source: Ambulatory Visit | Attending: Urology | Admitting: Urology

## 2021-01-16 ENCOUNTER — Other Ambulatory Visit: Payer: Self-pay

## 2021-01-16 VITALS — BP 138/72 | HR 74 | Temp 98.6°F | Ht 68.0 in | Wt 248.0 lb

## 2021-01-16 DIAGNOSIS — N200 Calculus of kidney: Secondary | ICD-10-CM | POA: Diagnosis not present

## 2021-01-16 DIAGNOSIS — D7389 Other diseases of spleen: Secondary | ICD-10-CM | POA: Diagnosis not present

## 2021-01-16 DIAGNOSIS — N2889 Other specified disorders of kidney and ureter: Secondary | ICD-10-CM | POA: Diagnosis not present

## 2021-01-16 DIAGNOSIS — R310 Gross hematuria: Secondary | ICD-10-CM | POA: Diagnosis not present

## 2021-01-16 DIAGNOSIS — K5730 Diverticulosis of large intestine without perforation or abscess without bleeding: Secondary | ICD-10-CM | POA: Diagnosis not present

## 2021-01-16 LAB — URINALYSIS, ROUTINE W REFLEX MICROSCOPIC
Bilirubin, UA: NEGATIVE
Glucose, UA: NEGATIVE
Ketones, UA: NEGATIVE
Leukocytes,UA: NEGATIVE
Nitrite, UA: NEGATIVE
Protein,UA: NEGATIVE
Specific Gravity, UA: 1.02 (ref 1.005–1.030)
Urobilinogen, Ur: 0.2 mg/dL (ref 0.2–1.0)
pH, UA: 7 (ref 5.0–7.5)

## 2021-01-16 LAB — MICROSCOPIC EXAMINATION
Bacteria, UA: NONE SEEN
Epithelial Cells (non renal): NONE SEEN /hpf (ref 0–10)
Renal Epithel, UA: NONE SEEN /hpf
WBC, UA: NONE SEEN /hpf (ref 0–5)

## 2021-01-16 LAB — POCT I-STAT CREATININE: Creatinine, Ser: 0.7 mg/dL (ref 0.61–1.24)

## 2021-01-16 MED ORDER — IOHEXOL 300 MG/ML  SOLN
100.0000 mL | Freq: Once | INTRAMUSCULAR | Status: AC | PRN
  Administered 2021-01-16: 100 mL via INTRAVENOUS

## 2021-01-16 MED ORDER — IOHEXOL 300 MG/ML  SOLN
25.0000 mL | Freq: Once | INTRAMUSCULAR | Status: AC | PRN
  Administered 2021-01-16: 25 mL via INTRAVENOUS

## 2021-01-16 NOTE — Patient Instructions (Signed)

## 2021-01-16 NOTE — Progress Notes (Signed)
01/16/2021 10:34 AM   Brandon Nelson 1961/01/03 [id number]  Referring provider: Sharilyn Sites, MD [address] [place],   [address]  nephrolithiasis  HPI: [name] is a 60yo here for evaluation of nephrolithiasis. He previously saw Dr. Michela Pitcher for nephrolithiasis and has required ureteroscopy. He is having intermittent right flank pain and underwent abdominal US 5/6 which showed a 49mm right mid pole calculus. He had 2 episodes of gross in the past weak. He has mild LUTS. IPSS 5 QOL 1. He has a 80pk year smoking hx. No dysuria.  He is a Administrator  PMH: Past Medical History:  Diagnosis Date  . Anxiety   . Arthritis   . Chronic kidney disease    kidney stones  . COPD (chronic obstructive pulmonary disease) ([place])   . GERD (gastroesophageal reflux disease)   . Hyperlipidemia   . Hypertension   . Shortness of breath    exertion    Surgical History: Past Surgical History:  Procedure Laterality Date  . ANAL FISTULOTOMY    . COLONOSCOPY N/A 07/05/2015   Procedure: COLONOSCOPY;  Surgeon: Rogene Houston, MD;  [facility]: [facility];  Service: Endoscopy;  Laterality: N/A;  11:55  . CYSTOSCOPY    . CYSTOSCOPY W/ RETROGRADES Right 07/06/2013   Procedure: CYSTOSCOPY WITH RETROGRADE PYELOGRAM;  Surgeon: [name], MD;  Location: [facility];  Service: Urology;  Laterality: Right;  . LAPAROSCOPIC APPENDECTOMY N/A 03/13/2017   Procedure: APPENDECTOMY LAPAROSCOPIC POSSIBLE OPEN EXCISION OF ABDOMINAL WALL MASS;  Surgeon: Andres Labrum, MD;  Location: [facility];  Service: General;  Laterality: N/A;  . left knee arthroscopy    . right knee arthroscopy    . right tendon foot    . STONE EXTRACTION WITH BASKET Right 07/06/2013   Procedure: STONE EXTRACTION WITH BASKET;  Surgeon: [name], MD;  Location: [facility];  Service: Urology;  Laterality: Right;  . URETEROSCOPY Right 07/06/2013   Procedure: URETEROSCOPY;  Surgeon: [name], MD;  Location: [facility];  Service:  Urology;  Laterality: Right;    Home Medications:  Allergies as of 01/16/2021   No Known Allergies     Medication List       Accurate as of Jan 16, 2021 10:34 AM. If you have any questions, ask your nurse or doctor.        acetaminophen 500 MG tablet Commonly known as: TYLENOL Take 500 mg by mouth every 6 (six) hours as needed for mild pain.   albuterol 108 (90 Base) MCG/ACT inhaler Commonly known as: VENTOLIN HFA Inhale 2 puffs into the lungs.   amLODipine 5 MG tablet Commonly known as: NORVASC Take 5 mg by mouth daily.   aspirin 81 MG tablet Take 1 tablet (81 mg total) by mouth daily.   lisinopril 20 MG tablet Commonly known as: ZESTRIL Take 20 mg by mouth daily.   meloxicam 7.5 MG tablet Commonly known as: MOBIC Take 15 mg by mouth at bedtime.   metFORMIN 500 MG tablet Commonly known as: GLUCOPHAGE Take 500 mg by mouth 2 (two) times daily.   metoprolol tartrate 100 MG tablet Commonly known as: LOPRESSOR Take 1 tablet (100 mg total) by mouth once for 1 dose. Take 2 hours Prior to CT Scan   nitroGLYCERIN 0.4 MG SL tablet Commonly known as: NITROSTAT Place 1 tablet (0.4 mg total) under the tongue every 5 (five) minutes as needed for chest pain.   pantoprazole 40 MG tablet Commonly known as: PROTONIX Take  40 mg by mouth daily as needed.   rosuvastatin 10 MG tablet Commonly known as: CRESTOR Take 1 tablet (10 mg total) by mouth daily.       Allergies: No Known Allergies  Family History: No family history on file.  Social History:  reports that he has been smoking cigarettes. He has a 80.00 pack-year smoking history. He has quit using smokeless tobacco. He reports current alcohol use. He reports that he does not use drugs.  ROS: All other review of systems were reviewed and are negative except what is noted above in HPI  Physical Exam: BP 138/72   Pulse 74   Temp 98.6 F (37 C) (Oral)   Ht 5\' 8"  (1.727 m)   Wt 248 lb (112.5 kg)   BMI 37.71  kg/m   Constitutional:  Alert and oriented, No acute distress. HEENT: [place] AT, moist mucus membranes.  Trachea midline, no masses. Cardiovascular: No clubbing, cyanosis, or edema. Respiratory: Normal respiratory effort, no increased work of breathing. GI: Abdomen is soft, nontender, nondistended, no abdominal masses GU: No CVA tenderness.  Lymph: No cervical or inguinal lymphadenopathy. Skin: No rashes, bruises or suspicious lesions. Neurologic: Grossly intact, no focal deficits, moving all 4 extremities. Psychiatric: Normal mood and affect.  Laboratory Data: Lab Results  Component Value Date   WBC 9.3 08/25/2019   HGB 15.8 08/25/2019   HCT 48.6 08/25/2019   MCV 97.6 08/25/2019   PLT 302 08/25/2019    Lab Results  Component Value Date   CREATININE 0.70 03/16/2020    No results found for: PSA  No results found for: TESTOSTERONE  Lab Results  Component Value Date   HGBA1C 6.0 (H) 08/24/2019    Urinalysis No results found for: COLORURINE, APPEARANCEUR, LABSPEC, PHURINE, GLUCOSEU, HGBUR, BILIRUBINUR, KETONESUR, PROTEINUR, UROBILINOGEN, NITRITE, LEUKOCYTESUR  No results found for: LABMICR, [place], RBCUA, LABEPIT, MUCUS, BACTERIA  Pertinent Imaging: Abdominal US 01/04/2021: Images reviewed and discussed with the patient No results found for this or any previous visit.  No results found for this or any previous visit.  No results found for this or any previous visit.  No results found for this or any previous visit.  No results found for this or any previous visit.  No results found for this or any previous visit.  No results found for this or any previous visit.  Results for orders placed during the hospital encounter of 11/02/17  CT RENAL STONE STUDY  Narrative CLINICAL DATA:  Low mid pelvic pain for 3-4 weeks. History of kidney stones. Hematuria 2 weeks ago.  EXAM: CT ABDOMEN AND PELVIS WITHOUT CONTRAST  TECHNIQUE: Multidetector CT imaging of the abdomen  and pelvis was performed following the standard protocol without IV contrast.  COMPARISON:  CT abdomen dated 03/12/2017  FINDINGS: Lower chest: No acute abnormality.  Hepatobiliary: Liver is low in density indicating fatty infiltration. No focal mass or lesion appreciated within the liver. Gallbladder appears normal. No bile duct dilatation.  Pancreas: Unremarkable. No pancreatic ductal dilatation or surrounding inflammatory changes.  Spleen: Normal in size without focal abnormality.  Adrenals/Urinary Tract: Adrenal glands appear normal. Kidneys are unremarkable without mass, stone or hydronephrosis. No perinephric edema. No ureteral or bladder calculi identified. Bladder appears normal.  Stomach/Bowel: Bowel is normal in caliber. Scattered diverticulosis noted within the descending and sigmoid colon but no evidence of acute diverticulitis. No bowel wall thickening or evidence of bowel wall inflammation seen. Status post appendectomy. Stomach appears normal.  Vascular/Lymphatic: No significant vascular findings are present.  No enlarged abdominal or pelvic lymph nodes.  Reproductive: Prostate is unremarkable.  Other: No free fluid or abscess collection. No free intraperitoneal air.  Musculoskeletal: Mild degenerative spurring within the upper lumbar and lower thoracic spine. No acute or suspicious osseous finding.  IMPRESSION: 1. No acute findings. No renal or ureteral calculi. No bowel obstruction or evidence of bowel wall inflammation. No evidence of acute solid organ abnormality. 2. Fatty infiltration of the liver. 3. Colonic diverticulosis without evidence of acute diverticulitis.   Electronically Signed By: Franki Cabot M.D. On: 11/02/2017 12:42   Assessment & Plan:    1. Kidney stones -We will await CT results prior to scheduling procedure  2. Gross hematuria -BMP  -CT hematuria -possible office cystoscopy   No follow-ups on file.  Nicolette Bang, MD  [facility] [place]

## 2021-01-16 NOTE — Progress Notes (Signed)
Urological Symptom Review  Patient is experiencing the following symptoms: Frequent urination Get up at night to urinate Blood in urine Kidney stones  Review of Systems  Gastrointestinal (upper)  : Negative for upper GI symptoms  Gastrointestinal (lower) : Negative for lower GI symptoms  Constitutional : Negative for symptoms  Skin: Itching  Eyes: Negative for eye symptoms  Ear/Nose/Throat : Negative for Ear/Nose/Throat symptoms  Hematologic/Lymphatic: Negative for Hematologic/Lymphatic symptoms  Cardiovascular : Negative for cardiovascular symptoms  Respiratory : Cough  Shortness of Breath  Endocrine: Negative for endocrine symptoms  Musculoskeletal: Back pain  Neurological: Negative for neurological symptoms  Psychologic: Negative for psychiatric symptoms

## 2021-01-17 LAB — BASIC METABOLIC PANEL
BUN/Creatinine Ratio: 12 (ref 10–24)
BUN: 9 mg/dL (ref 8–27)
CO2: 25 mmol/L (ref 20–29)
Calcium: 9 mg/dL (ref 8.6–10.2)
Chloride: 98 mmol/L (ref 96–106)
Creatinine, Ser: 0.75 mg/dL — ABNORMAL LOW (ref 0.76–1.27)
Glucose: 97 mg/dL (ref 65–99)
Potassium: 4.7 mmol/L (ref 3.5–5.2)
Sodium: 140 mmol/L (ref 134–144)
eGFR: 103 mL/min/{1.73_m2} (ref >59)

## 2021-01-18 ENCOUNTER — Telehealth: Payer: Self-pay

## 2021-02-01 ENCOUNTER — Encounter: Payer: Self-pay | Admitting: Urology

## 2021-02-01 ENCOUNTER — Ambulatory Visit (INDEPENDENT_AMBULATORY_CARE_PROVIDER_SITE_OTHER): Payer: BC Managed Care – PPO | Admitting: Urology

## 2021-02-01 ENCOUNTER — Other Ambulatory Visit: Payer: Self-pay

## 2021-02-01 VITALS — BP 155/78 | HR 81 | Ht 68.0 in

## 2021-02-01 DIAGNOSIS — N200 Calculus of kidney: Secondary | ICD-10-CM | POA: Diagnosis not present

## 2021-02-01 MED ORDER — CIPROFLOXACIN HCL 500 MG PO TABS
500.0000 mg | ORAL_TABLET | Freq: Once | ORAL | Status: AC
  Administered 2021-02-01: 500 mg via ORAL

## 2021-02-01 NOTE — Telephone Encounter (Signed)
Pt here for OV today MD will discuss CT finding.

## 2021-02-01 NOTE — Progress Notes (Signed)

## 2021-02-01 NOTE — Telephone Encounter (Signed)
-----   Message from Cleon Gustin, MD sent at 01/29/2021 11:38 AM EDT ----- CT showed no GU abnormalities ----- Message ----- From: Dorisann Frames, RN Sent: 01/16/2021   1:12 PM EDT To: Cleon Gustin, MD  Please review

## 2021-02-01 NOTE — Progress Notes (Signed)
   02/01/21  CC: gross hematuria  HPI: Mr Nordin is a 60yo here for followup for gross hematuria. CT hematuria protocol showed no GU abnormalities  Blood pressure (!) 155/78, pulse 81, height 5\' 8"  (1.727 m). NED. A&Ox3.   No respiratory distress   Abd soft, NT, ND Normal phallus with bilateral descended testicles  Cystoscopy Procedure Note  Patient identification was confirmed, informed consent was obtained, and patient was prepped using Betadine solution.  Lidocaine jelly was administered per urethral meatus.     Pre-Procedure: - Inspection reveals a normal caliber ureteral meatus.  Procedure: The flexible cystoscope was introduced without difficulty - No urethral strictures/lesions are present. - Enlarged prostate  - Normal bladder neck - Bilateral ureteral orifices identified - Bladder mucosa  reveals no ulcers, tumors, or lesions - No bladder stones - No trabeculation  Retroflexion shows 1cm intravesical prostatic protrusion   Post-Procedure: - Patient tolerated the procedure well  Assessment/ Plan:  RTC 6 months with UA  Nicolette Bang, MD

## 2021-02-12 ENCOUNTER — Encounter: Payer: Self-pay | Admitting: Urology

## 2021-02-12 NOTE — Patient Instructions (Signed)
Benign Prostatic Hyperplasia  Benign prostatic hyperplasia (BPH) is an enlarged prostate gland that is caused by the normal aging process and not by cancer. The prostate is a walnut-sized gland that is involved in the production of semen. It is located in front of the rectum and below the bladder. The bladder stores urine and the urethra is the tube that carries the urine out of the body. The prostate may get bigger asa man gets older. An enlarged prostate can press on the urethra. This can make it harder to pass urine. The build-up of urine in the bladder can cause infection. Back pressure and infection may progress to bladder damage and kidney (renal) failure. What are the causes? This condition is part of a normal aging process. However, not all men develop problems from this condition. If the prostate enlarges away from the urethra, urine flow will not be blocked. If it enlarges toward the urethra andcompresses it, there will be problems passing urine. What increases the risk? This condition is more likely to develop in men over the age of 50 years. What are the signs or symptoms? Symptoms of this condition include: Getting up often during the night to urinate. Needing to urinate frequently during the day. Difficulty starting urine flow. Decrease in size and strength of your urine stream. Leaking (dribbling) after urinating. Inability to pass urine. This needs immediate treatment. Inability to completely empty your bladder. Pain when you pass urine. This is more common if there is also an infection. Urinary tract infection (UTI). How is this diagnosed? This condition is diagnosed based on your medical history, a physical exam, and your symptoms. Tests will also be done, such as: A post-void bladder scan. This measures any amount of urine that may remain in your bladder after you finish urinating. A digital rectal exam. In a rectal exam, your health care provider checks your prostate by  putting a lubricated, gloved finger into your rectum to feel the back of your prostate gland. This exam detects the size of your gland and any abnormal lumps or growths. An exam of your urine (urinalysis). A prostate specific antigen (PSA) screening. This is a blood test used to screen for prostate cancer. An ultrasound. This test uses sound waves to electronically produce a picture of your prostate gland. Your health care provider may refer you to a specialist in kidney and prostate diseases (urologist). How is this treated? Once symptoms begin, your health care provider will monitor your condition (active surveillance or watchful waiting). Treatment for this condition will depend on the severity of your condition. Treatment may include: Observation and yearly exams. This may be the only treatment needed if your condition and symptoms are mild. Medicines to relieve your symptoms, including: Medicines to shrink the prostate. Medicines to relax the muscle of the prostate. Surgery in severe cases. Surgery may include: Prostatectomy. In this procedure, the prostate tissue is removed completely through an open incision or with a laparoscope or robotics. Transurethral resection of the prostate (TURP). In this procedure, a tool is inserted through the opening at the tip of the penis (urethra). It is used to cut away tissue of the inner core of the prostate. The pieces are removed through the same opening of the penis. This removes the blockage. Transurethral incision (TUIP). In this procedure, small cuts are made in the prostate. This lessens the prostate's pressure on the urethra. Transurethral microwave thermotherapy (TUMT). This procedure uses microwaves to create heat. The heat destroys and removes a small   amount of prostate tissue. Transurethral needle ablation (TUNA). This procedure uses radio frequencies to destroy and remove a small amount of prostate tissue. Interstitial laser coagulation (ILC).  This procedure uses a laser to destroy and remove a small amount of prostate tissue. Transurethral electrovaporization (TUVP). This procedure uses electrodes to destroy and remove a small amount of prostate tissue. Prostatic urethral lift. This procedure inserts an implant to push the lobes of the prostate away from the urethra. Follow these instructions at home: Take over-the-counter and prescription medicines only as told by your health care provider. Monitor your symptoms for any changes. Contact your health care provider with any changes. Avoid drinking large amounts of liquid before going to bed or out in public. Avoid or reduce how much caffeine or alcohol you drink. Give yourself time when you urinate. Keep all follow-up visits as told by your health care provider. This is important. Contact a health care provider if: You have unexplained back pain. Your symptoms do not get better with treatment. You develop side effects from the medicine you are taking. Your urine becomes very dark or has a bad smell. Your lower abdomen becomes distended and you have trouble passing your urine. Get help right away if: You have a fever or chills. You suddenly cannot urinate. You feel lightheaded, or very dizzy, or you faint. There are large amounts of blood or clots in the urine. Your urinary problems become hard to manage. You develop moderate to severe low back or flank pain. The flank is the side of your body between the ribs and the hip. These symptoms may represent a serious problem that is an emergency. Do not wait to see if the symptoms will go away. Get medical help right away. Call your local emergency services (911 in the [place] Do not drive yourself to the hospital. Summary Benign prostatic hyperplasia (BPH) is an enlarged prostate that is caused by the normal aging process and not by cancer. An enlarged prostate can press on the urethra. This can make it hard to pass urine. This  condition is part of a normal aging process and is more likely to develop in men over the age of 50 years. Get help right away if you suddenly cannot urinate. This information is not intended to replace advice given to you by your health care provider. Make sure you discuss any questions you have with your healthcare provider. Document Revised: 04/26/2020 Document Reviewed: 04/26/2020 Elsevier Patient Education  2022 [employer]  

## 2021-02-18 DIAGNOSIS — M1991 Primary osteoarthritis, unspecified site: Secondary | ICD-10-CM | POA: Diagnosis not present

## 2021-02-18 DIAGNOSIS — Z6837 Body mass index (BMI) 37.0-37.9, adult: Secondary | ICD-10-CM | POA: Diagnosis not present

## 2021-02-26 DIAGNOSIS — Z6837 Body mass index (BMI) 37.0-37.9, adult: Secondary | ICD-10-CM | POA: Diagnosis not present

## 2021-02-26 DIAGNOSIS — I209 Angina pectoris, unspecified: Secondary | ICD-10-CM | POA: Diagnosis not present

## 2021-02-26 DIAGNOSIS — M47816 Spondylosis without myelopathy or radiculopathy, lumbar region: Secondary | ICD-10-CM | POA: Diagnosis not present

## 2021-02-26 DIAGNOSIS — J449 Chronic obstructive pulmonary disease, unspecified: Secondary | ICD-10-CM | POA: Diagnosis not present

## 2021-02-26 DIAGNOSIS — I700 Atherosclerosis of aorta: Secondary | ICD-10-CM | POA: Diagnosis not present

## 2021-04-16 DIAGNOSIS — J069 Acute upper respiratory infection, unspecified: Secondary | ICD-10-CM | POA: Diagnosis not present

## 2021-04-16 DIAGNOSIS — H6693 Otitis media, unspecified, bilateral: Secondary | ICD-10-CM | POA: Diagnosis not present

## 2021-04-16 DIAGNOSIS — Z6837 Body mass index (BMI) 37.0-37.9, adult: Secondary | ICD-10-CM | POA: Diagnosis not present

## 2021-05-13 ENCOUNTER — Ambulatory Visit (INDEPENDENT_AMBULATORY_CARE_PROVIDER_SITE_OTHER): Payer: BC Managed Care – PPO | Admitting: Gastroenterology

## 2021-05-23 DIAGNOSIS — K529 Noninfective gastroenteritis and colitis, unspecified: Secondary | ICD-10-CM | POA: Diagnosis not present

## 2021-05-23 DIAGNOSIS — M7552 Bursitis of left shoulder: Secondary | ICD-10-CM | POA: Diagnosis not present

## 2021-05-23 DIAGNOSIS — K146 Glossodynia: Secondary | ICD-10-CM | POA: Diagnosis not present

## 2021-05-23 DIAGNOSIS — M7551 Bursitis of right shoulder: Secondary | ICD-10-CM | POA: Diagnosis not present

## 2021-07-02 DIAGNOSIS — N200 Calculus of kidney: Secondary | ICD-10-CM | POA: Diagnosis not present

## 2021-07-02 DIAGNOSIS — Z6837 Body mass index (BMI) 37.0-37.9, adult: Secondary | ICD-10-CM | POA: Diagnosis not present

## 2021-08-09 ENCOUNTER — Ambulatory Visit: Payer: BC Managed Care – PPO | Admitting: Urology

## 2021-08-30 DIAGNOSIS — J449 Chronic obstructive pulmonary disease, unspecified: Secondary | ICD-10-CM | POA: Diagnosis not present

## 2021-08-30 DIAGNOSIS — K760 Fatty (change of) liver, not elsewhere classified: Secondary | ICD-10-CM | POA: Diagnosis not present

## 2021-08-30 DIAGNOSIS — Z13220 Encounter for screening for lipoid disorders: Secondary | ICD-10-CM | POA: Diagnosis not present

## 2021-08-30 DIAGNOSIS — M7551 Bursitis of right shoulder: Secondary | ICD-10-CM | POA: Diagnosis not present

## 2021-08-30 DIAGNOSIS — I10 Essential (primary) hypertension: Secondary | ICD-10-CM | POA: Diagnosis not present

## 2021-08-30 DIAGNOSIS — M7552 Bursitis of left shoulder: Secondary | ICD-10-CM | POA: Diagnosis not present

## 2021-08-30 DIAGNOSIS — N200 Calculus of kidney: Secondary | ICD-10-CM | POA: Diagnosis not present

## 2021-08-30 DIAGNOSIS — Z0000 Encounter for general adult medical examination without abnormal findings: Secondary | ICD-10-CM | POA: Diagnosis not present

## 2021-08-30 DIAGNOSIS — K146 Glossodynia: Secondary | ICD-10-CM | POA: Diagnosis not present

## 2021-08-30 DIAGNOSIS — Z6837 Body mass index (BMI) 37.0-37.9, adult: Secondary | ICD-10-CM | POA: Diagnosis not present

## 2021-08-30 DIAGNOSIS — K5730 Diverticulosis of large intestine without perforation or abscess without bleeding: Secondary | ICD-10-CM | POA: Diagnosis not present

## 2021-08-30 DIAGNOSIS — E119 Type 2 diabetes mellitus without complications: Secondary | ICD-10-CM | POA: Diagnosis not present

## 2021-08-30 DIAGNOSIS — E669 Obesity, unspecified: Secondary | ICD-10-CM | POA: Diagnosis not present

## 2022-05-30 IMAGING — CT CT ABD-PELV W/ CM
2 of 5 series · 17 of 46 positions shown, 19 images · IV contrast (Omnipaque or Isovue)
Comparison: Noncontrast CT on 11/02/2017

CLINICAL DATA: Generalized abdominal pain and diarrhea for 1 month.
Previous appendectomy.

EXAM:
CT ABDOMEN AND PELVIS WITH CONTRAST
TECHNIQUE: Multidetector CT imaging of the abdomen and pelvis was performed
using the standard protocol following bolus administration of
intravenous contrast.
CONTRAST:  100mL OMNIPAQUE IOHEXOL 300 MG/ML  SOLN

[Series 2: axial st · axial · 0.98mm/px · z∈[+690,+1110]mm · 14 of 98 slices shown, 16 images]
[im 7/98  soft-tissue]
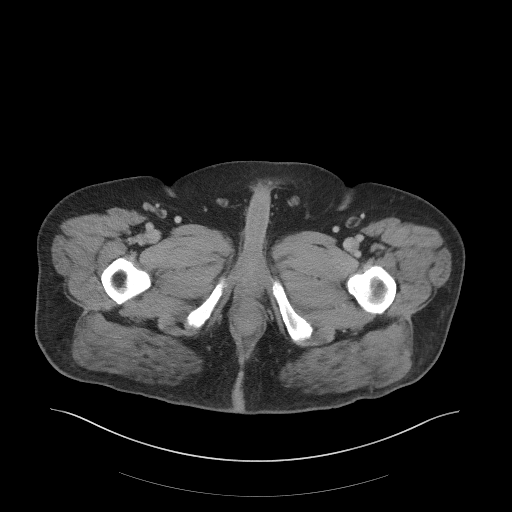
[im 7/98  bone]
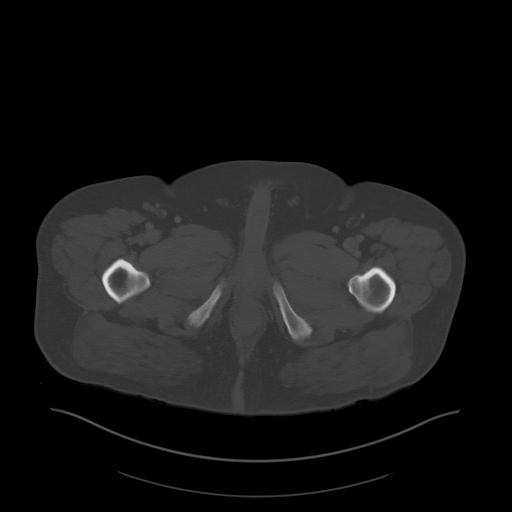
[im 13/98  soft-tissue]
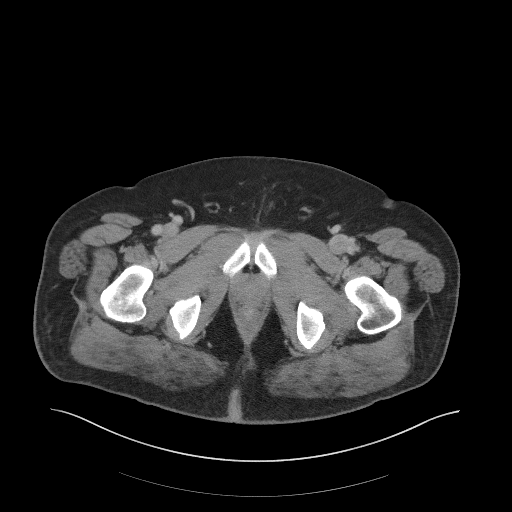
[im 19/98  soft-tissue]
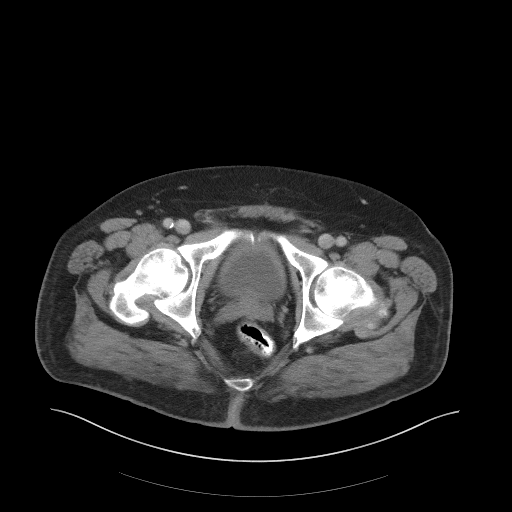
[im 25/98  soft-tissue]
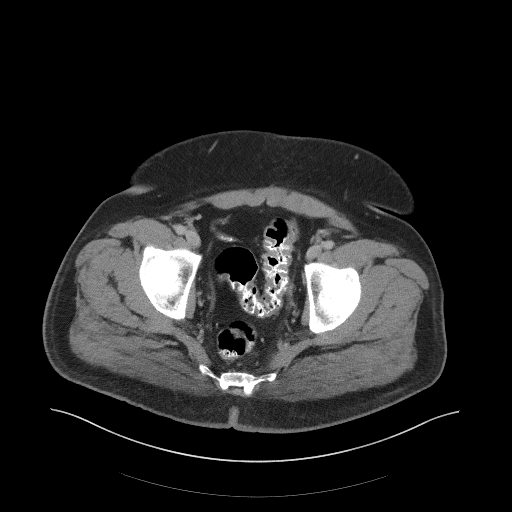
[im 31/98  soft-tissue]
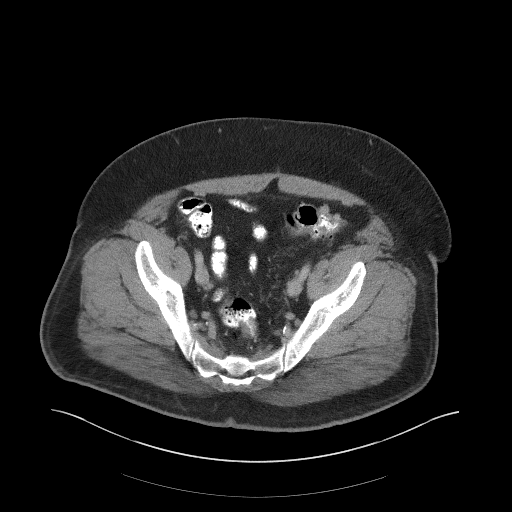
[im 37/98  soft-tissue]
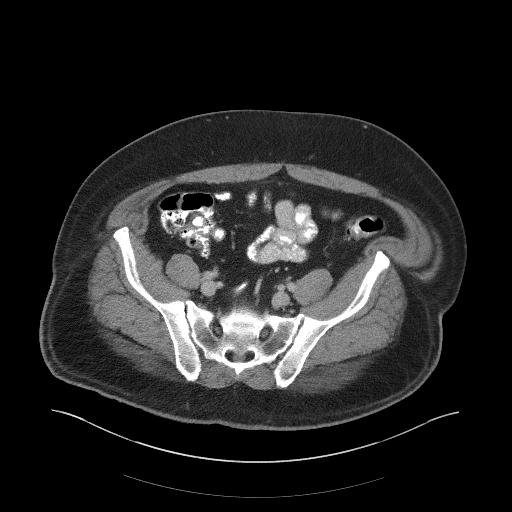
[im 43/98  soft-tissue]
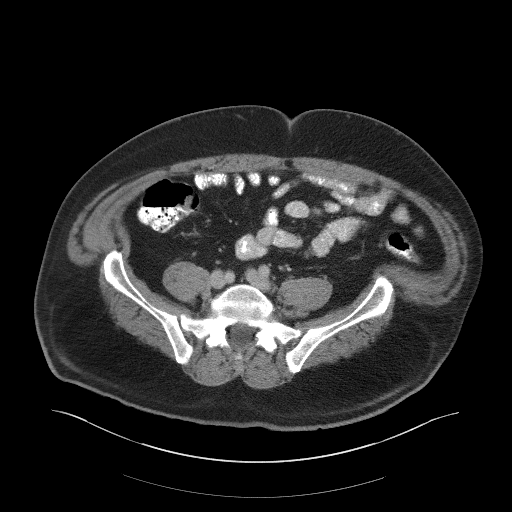
[im 55/98  soft-tissue]
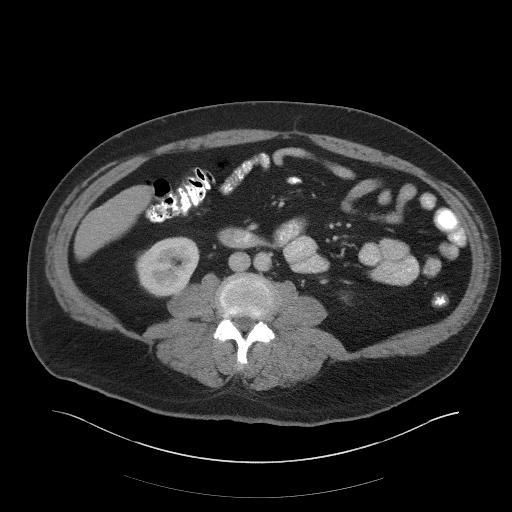
[im 61/98  soft-tissue]
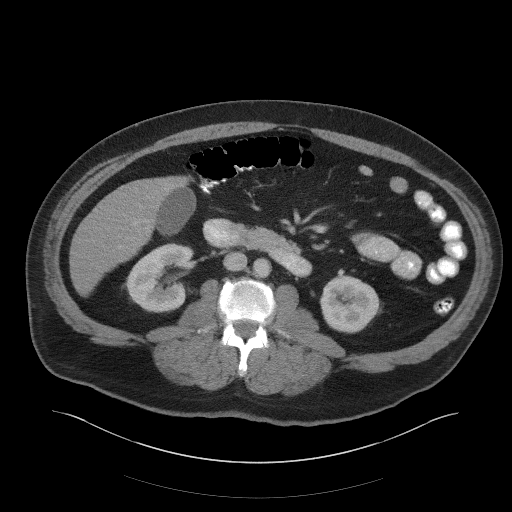
[im 61/98  bone]
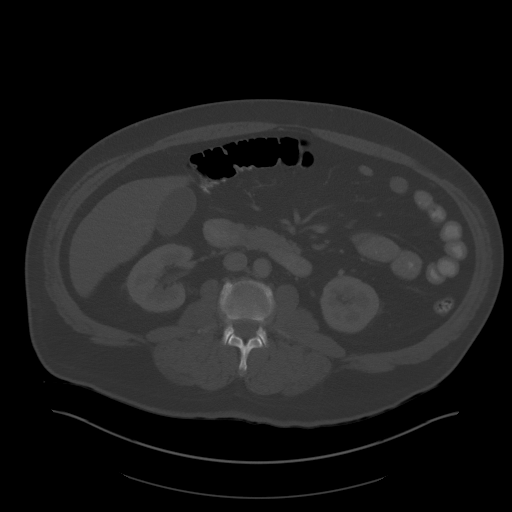
[im 67/98  soft-tissue]
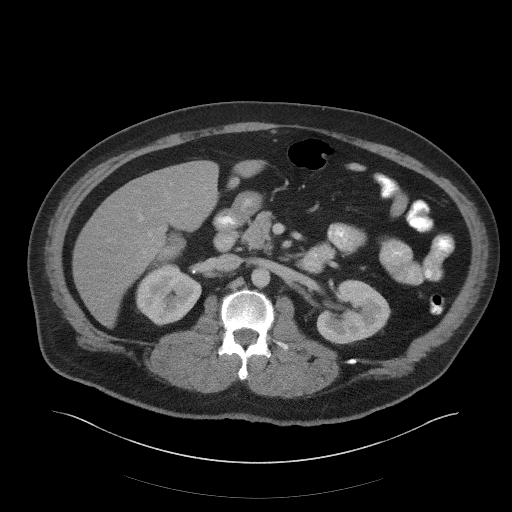
[im 73/98  soft-tissue]
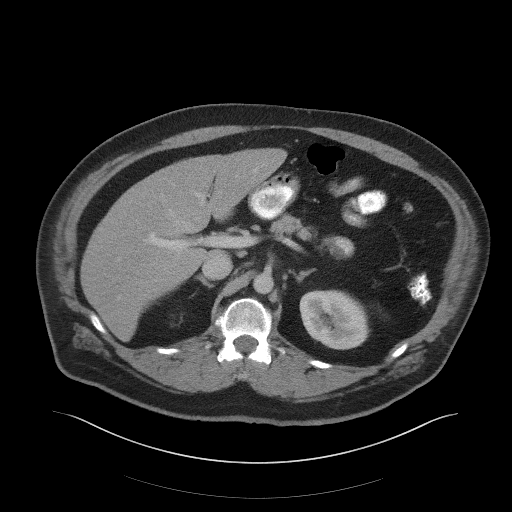
[im 79/98  soft-tissue]
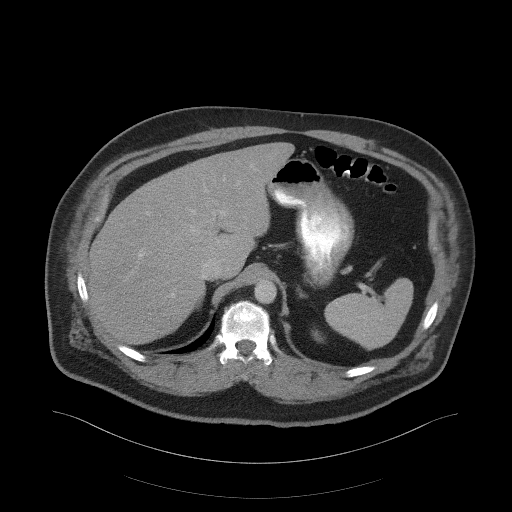
[im 85/98  soft-tissue]
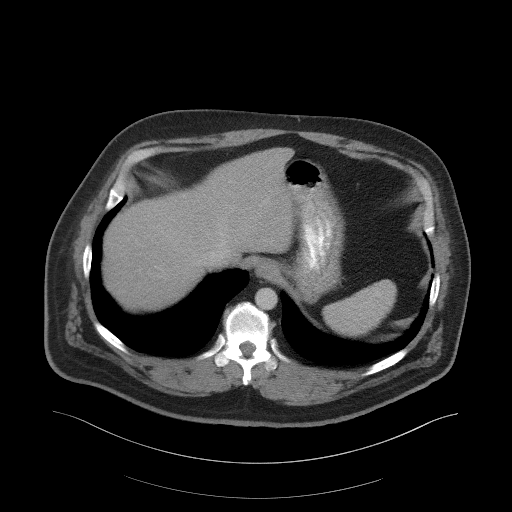
[im 91/98  soft-tissue]
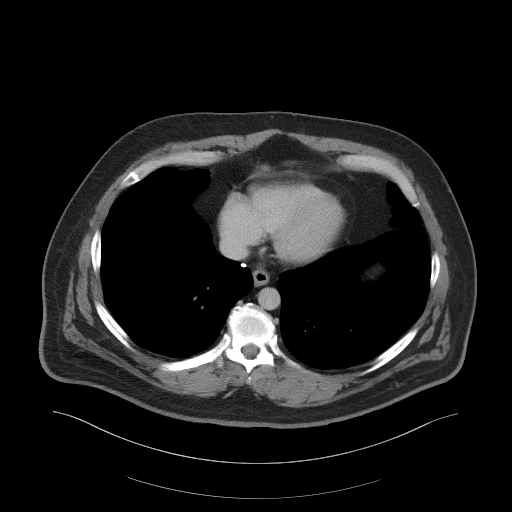

[Series 6: coronal st · coronal · 0.97mm/px · 3 of 114 slices shown]
[im 38/114  soft-tissue]
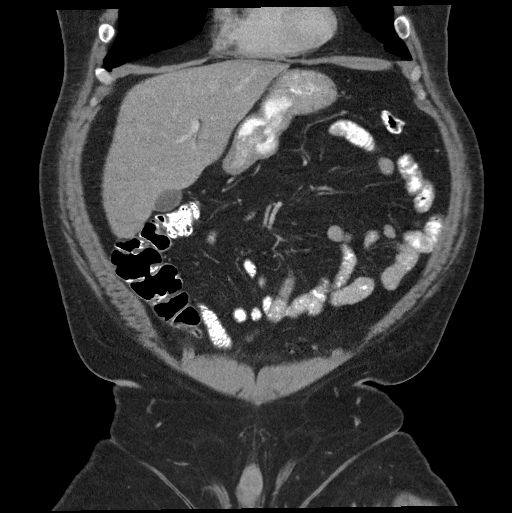
[im 51/114  soft-tissue]
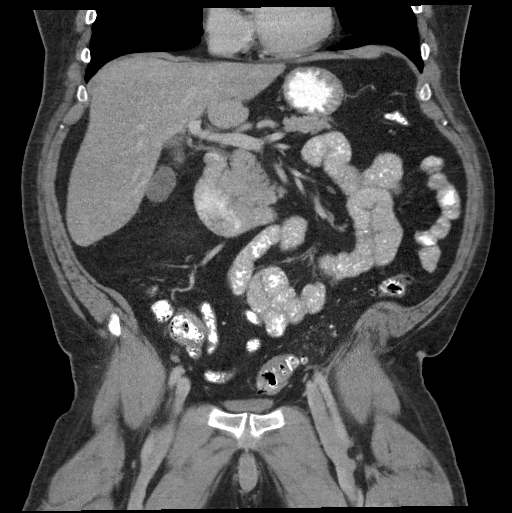
[im 63/114  soft-tissue]
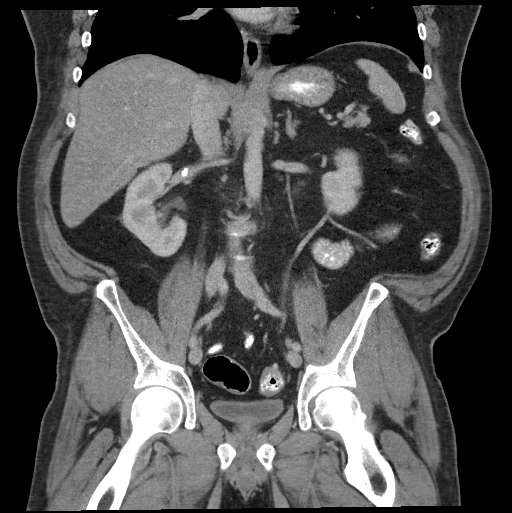

[17 of 46 positions shown; findings below may reference images not displayed]

FINDINGS: Lower Chest: No acute findings.

Hepatobiliary: No hepatic masses identified. Mild diffuse hepatic
steatosis again seen. Gallbladder is unremarkable. No evidence of
biliary ductal dilatation.

Pancreas:  No mass or inflammatory changes.

Spleen: Within normal limits in size and appearance.

Adrenals/Urinary Tract: No masses identified. No evidence of
ureteral calculi or hydronephrosis.

Stomach/Bowel: No evidence of obstruction, inflammatory process or
abnormal fluid collections. Diverticulosis is seen mainly involving
the sigmoid colon, however there is no evidence of diverticulitis.

Vascular/Lymphatic: No pathologically enlarged lymph nodes. No
abdominal aortic aneurysm. Mildly Aortic atherosclerosis noted.

Reproductive:  No mass or other significant abnormality.

Other:  None.

Musculoskeletal:  No suspicious bone lesions identified.
IMPRESSION: Colonic diverticulosis, without radiographic evidence of
diverticulitis or other acute findings.

Mild hepatic steatosis.

Aortic Atherosclerosis ([id number]).

## 2022-06-24 ENCOUNTER — Encounter (INDEPENDENT_AMBULATORY_CARE_PROVIDER_SITE_OTHER): Payer: Self-pay | Admitting: *Deleted
